# Patient Record
Sex: Female | Born: 1970
Health system: Southern US, Community
[De-identification: ages and names within clinical notes are randomized; demographics above are authoritative.]

## PROBLEM LIST (undated history)

## (undated) DIAGNOSIS — M1990 Unspecified osteoarthritis, unspecified site: Secondary | ICD-10-CM

## (undated) DIAGNOSIS — D649 Anemia, unspecified: Secondary | ICD-10-CM

## (undated) DIAGNOSIS — E039 Hypothyroidism, unspecified: Secondary | ICD-10-CM

## (undated) DIAGNOSIS — Z5189 Encounter for other specified aftercare: Secondary | ICD-10-CM

## (undated) DIAGNOSIS — K5190 Ulcerative colitis, unspecified, without complications: Secondary | ICD-10-CM

## (undated) DIAGNOSIS — K922 Gastrointestinal hemorrhage, unspecified: Secondary | ICD-10-CM

## (undated) DIAGNOSIS — K635 Polyp of colon: Secondary | ICD-10-CM

## (undated) HISTORY — DX: Gastrointestinal hemorrhage, unspecified: K92.2

## (undated) HISTORY — PX: COLONOSCOPY: SHX5424

## (undated) HISTORY — DX: Polyp of colon: K63.5

## (undated) HISTORY — DX: Encounter for other specified aftercare: Z51.89

## (undated) HISTORY — DX: Ulcerative colitis, unspecified, without complications: K51.90

## (undated) HISTORY — DX: Unspecified osteoarthritis, unspecified site: M19.90

## (undated) HISTORY — DX: Anemia, unspecified: D64.9

## (undated) HISTORY — DX: Hypothyroidism, unspecified: E03.9

## (undated) HISTORY — PX: HEMORRHOIDECTOMY WITH HEMORRHOID BANDING: SHX5633

---

## 1997-12-27 ENCOUNTER — Inpatient Hospital Stay (HOSPITAL_COMMUNITY): Admission: EM | Admit: 1997-12-27 | Discharge: 1997-12-28 | Payer: Self-pay

## 1999-01-31 ENCOUNTER — Inpatient Hospital Stay (HOSPITAL_COMMUNITY): Admission: EM | Admit: 1999-01-31 | Discharge: 1999-02-03 | Payer: Self-pay | Admitting: Emergency Medicine

## 1999-01-31 ENCOUNTER — Encounter: Payer: Self-pay | Admitting: Emergency Medicine

## 1999-06-07 ENCOUNTER — Emergency Department (HOSPITAL_COMMUNITY): Admission: EM | Admit: 1999-06-07 | Discharge: 1999-06-07 | Payer: Self-pay | Admitting: Emergency Medicine

## 1999-07-16 ENCOUNTER — Ambulatory Visit (HOSPITAL_COMMUNITY): Admission: RE | Admit: 1999-07-16 | Discharge: 1999-07-16 | Payer: Self-pay | Admitting: Gastroenterology

## 1999-07-16 ENCOUNTER — Encounter: Payer: Self-pay | Admitting: Gastroenterology

## 1999-10-16 ENCOUNTER — Ambulatory Visit (HOSPITAL_COMMUNITY): Admission: RE | Admit: 1999-10-16 | Discharge: 1999-10-16 | Payer: Self-pay | Admitting: Gastroenterology

## 1999-10-16 ENCOUNTER — Encounter: Payer: Self-pay | Admitting: Gastroenterology

## 2004-11-22 ENCOUNTER — Emergency Department (HOSPITAL_COMMUNITY): Admission: EM | Admit: 2004-11-22 | Discharge: 2004-11-22 | Payer: Self-pay | Admitting: Emergency Medicine

## 2014-03-22 ENCOUNTER — Encounter: Payer: Self-pay | Admitting: Internal Medicine

## 2014-05-13 ENCOUNTER — Encounter: Payer: Self-pay | Admitting: Internal Medicine

## 2014-05-30 ENCOUNTER — Ambulatory Visit (INDEPENDENT_AMBULATORY_CARE_PROVIDER_SITE_OTHER): Payer: BLUE CROSS/BLUE SHIELD | Admitting: Internal Medicine

## 2014-05-30 ENCOUNTER — Other Ambulatory Visit (INDEPENDENT_AMBULATORY_CARE_PROVIDER_SITE_OTHER): Payer: BLUE CROSS/BLUE SHIELD

## 2014-05-30 ENCOUNTER — Encounter: Payer: Self-pay | Admitting: Internal Medicine

## 2014-05-30 VITALS — BP 106/62 | HR 76 | Ht 60.0 in | Wt 131.4 lb

## 2014-05-30 DIAGNOSIS — K51919 Ulcerative colitis, unspecified with unspecified complications: Secondary | ICD-10-CM | POA: Diagnosis not present

## 2014-05-30 LAB — COMPREHENSIVE METABOLIC PANEL
ALT: 9 U/L (ref 0–35)
AST: 16 U/L (ref 0–37)
Albumin: 4.3 g/dL (ref 3.5–5.2)
Alkaline Phosphatase: 65 U/L (ref 39–117)
BUN: 5 mg/dL — ABNORMAL LOW (ref 6–23)
CO2: 28 mEq/L (ref 19–32)
Calcium: 9.5 mg/dL (ref 8.4–10.5)
Chloride: 104 mEq/L (ref 96–112)
Creatinine, Ser: 0.47 mg/dL (ref 0.40–1.20)
GFR: 154.8 mL/min (ref >60.00)
Glucose, Bld: 84 mg/dL (ref 70–99)
Potassium: 3.8 mEq/L (ref 3.5–5.1)
Sodium: 136 mEq/L (ref 135–145)
Total Bilirubin: 0.4 mg/dL (ref 0.2–1.2)
Total Protein: 7.7 g/dL (ref 6.0–8.3)

## 2014-05-30 LAB — CBC WITH DIFFERENTIAL/PLATELET
Basophils Absolute: 0 10*3/uL (ref 0.0–0.1)
Basophils Relative: 0.2 % (ref 0.0–3.0)
Eosinophils Absolute: 0 10*3/uL (ref 0.0–0.7)
Eosinophils Relative: 0.2 % (ref 0.0–5.0)
HCT: 35.4 % — ABNORMAL LOW (ref 36.0–46.0)
Hemoglobin: 11.5 g/dL — ABNORMAL LOW (ref 12.0–15.0)
Lymphocytes Relative: 27.6 % (ref 12.0–46.0)
Lymphs Abs: 1.8 10*3/uL (ref 0.7–4.0)
MCHC: 32.5 g/dL (ref 30.0–36.0)
MCV: 72.7 fl — ABNORMAL LOW (ref 78.0–100.0)
Monocytes Absolute: 0.3 10*3/uL (ref 0.1–1.0)
Monocytes Relative: 4.4 % (ref 3.0–12.0)
Neutro Abs: 4.4 10*3/uL (ref 1.4–7.7)
Neutrophils Relative %: 67.6 % (ref 43.0–77.0)
Platelets: 361 10*3/uL (ref 150.0–400.0)
RBC: 4.87 Mil/uL (ref 3.87–5.11)
RDW: 16.9 % — ABNORMAL HIGH (ref 11.5–15.5)
WBC: 6.6 10*3/uL (ref 4.0–10.5)

## 2014-05-30 LAB — IBC PANEL
Iron: 28 ug/dL — ABNORMAL LOW (ref 42–145)
Saturation Ratios: 7.7 % — ABNORMAL LOW (ref 20.0–50.0)
Transferrin: 260 mg/dL (ref 212.0–360.0)

## 2014-05-30 LAB — VITAMIN B12: Vitamin B-12: 376 pg/mL (ref 211–911)

## 2014-05-30 LAB — SEDIMENTATION RATE: Sed Rate: 17 mm/hr (ref 0–22)

## 2014-05-30 NOTE — Patient Instructions (Addendum)
Dr Edison Pace, [facility], [place] physician has requested that you go to the basement for lab work before leaving today. You have been scheduled for a colonoscopy. Please follow written instructions given to you at your visit today.  Please pick up your prep supplies at the pharmacy within the next 1-3 days. If you use inhalers (even only as needed), please bring them with you on the day of your procedure. Your physician has requested that you go to www.startemmi.com and enter the access code given to you at your visit today. This web site gives a general overview about your procedure. However, you should still follow specific instructions given to you by our office regarding your preparation for the procedure.

## 2014-05-30 NOTE — Progress Notes (Signed)
Ebony [name] 05/26/1970 [id number]  Note: This dictation was prepared with Dragon digital system. Any transcriptional errors that result from this procedure are unintentional.   History of Present Illness: This is a 44 year old white female who has a diagnosis of ulcerative colitis since 1995 followed by Dr. Amedeo Plenty. She stopped all medical follow up in 2001 after she lost her insurance and has not been seen for past 15 years. We do not have records from her initial diagnosis but she describes having severe diarrhea ,rectal bleeding requiring hospitalization and blood transfusions. She was on steroids and mesalamine. She was never on biologicals. After she lost her insurance her disease slowly calmed  down and she was able to function with the exception all for brief flareups of diarrhea mucosa fecal urgency and some bleeding. She has recently acquired an insurance and would like to be reassessed. There is no family history of inflammatory bowel disease or colon cancer. She denies aphthous stomatitis. She has joint pains involving her knees as well as her hands and wrists. Her weight has been stable. Her level of energy has been low. Ultrasound of the upper abdomen in June 2001 was negative. Barium esophagram in 2001 by Dr. Amedeo Plenty showed small hiatal hernia and normal motility.     Past Medical History  Diagnosis Date  . Anemia   . Colon polyps 1997 or 1998?  Marland Kitchen GI bleeding   . Ulcerative colitis     Past Surgical History  Procedure Laterality Date  . Colonoscopy  1997 or 1998?    No Known Allergies  Family history and social history have been reviewed.  Review of Systems: Intermittent diarrhea. Small amount of mucus and low-volume bleeding.  The remainder of the 10 point ROS is negative except as outlined in the H&P  Physical Exam: General Appearance Well developed, in no distress Eyes  Non icteric  HEENT  Non traumatic, normocephalic  Mouth No lesion, tongue papillated, no  cheilosis Neck Supple without adenopathy, thyroid not enlarged, no carotid bruits, no JVD Lungs Clear to auscultation bilaterally COR Normal S1, normal S2, regular rhythm, no murmur, quiet precordium Abdomen soft with minimal tenderness in left lower quadrant. Normoactive bowel sounds. No distention or tympany Rectal normal perianal area. Normal rectal sphincter tone. No stool Extremities  No pedal edema Skin No lesions Neurological Alert and oriented x 3 Psychological Normal mood and affect  Assessment and Plan:   44 year old white female with  at least 20 year history of ulcerative colitis lost to follow-up for past 15 years. She now has an insurance and needs to be reevaluated. She most likely she had a left-sided colitis. She was taking steroid enemas  and  Systemic steroids. Currently her symptoms are rather mild. We will schedule her for colonoscopy to assess the activity disease and to rule out dysplasia. We will also also check her iron levels. Blood count. B12 and sedimentation rate. We will discuss the treatment after we obtain information but the extent and severity of the disease. Today we have discussed possibility of biologicals or mesalamine or immunomodulators.    Ebony [name] 05/30/2014

## 2014-05-31 ENCOUNTER — Ambulatory Visit: Payer: Self-pay | Admitting: Internal Medicine

## 2014-06-04 ENCOUNTER — Encounter: Payer: Self-pay | Admitting: Internal Medicine

## 2014-06-21 ENCOUNTER — Telehealth: Payer: Self-pay | Admitting: Internal Medicine

## 2014-06-21 NOTE — Telephone Encounter (Signed)
Spoke with patient and explained that she has UC and it is a diagnostic procedure (colonoscopy). She cancelled the procedure until she can afford it.

## 2014-06-21 NOTE — Telephone Encounter (Signed)
She is having symptoms and she is only 44 yo, so it would be hard to code it as screening. The only way to code it as screening would be a HIGH risk for colon cancer  In patient with U,Colitis with greater than 20 years duration. We can try if they accept it.

## 2014-07-03 ENCOUNTER — Encounter: Payer: BLUE CROSS/BLUE SHIELD | Admitting: Internal Medicine

## 2015-08-19 ENCOUNTER — Other Ambulatory Visit: Payer: Self-pay | Admitting: Certified Nurse Midwife

## 2015-08-19 DIAGNOSIS — Z1231 Encounter for screening mammogram for malignant neoplasm of breast: Secondary | ICD-10-CM

## 2015-08-26 ENCOUNTER — Other Ambulatory Visit: Payer: Self-pay | Admitting: *Deleted

## 2015-08-26 ENCOUNTER — Inpatient Hospital Stay
Admission: RE | Admit: 2015-08-26 | Discharge: 2015-08-26 | Disposition: A | Discharge status: Home / Self Care | Payer: Self-pay | Source: Ambulatory Visit | Attending: *Deleted | Admitting: *Deleted

## 2015-08-26 DIAGNOSIS — Z9289 Personal history of other medical treatment: Secondary | ICD-10-CM

## 2015-09-04 ENCOUNTER — Other Ambulatory Visit: Payer: Self-pay | Admitting: Certified Nurse Midwife

## 2015-09-04 ENCOUNTER — Ambulatory Visit
Admission: RE | Admit: 2015-09-04 | Discharge: 2015-09-04 | Disposition: A | Discharge status: Home / Self Care | Payer: BLUE CROSS/BLUE SHIELD | Source: Ambulatory Visit | Attending: Certified Nurse Midwife | Admitting: Certified Nurse Midwife

## 2015-09-04 DIAGNOSIS — Z1231 Encounter for screening mammogram for malignant neoplasm of breast: Secondary | ICD-10-CM | POA: Insufficient documentation

## 2015-09-04 LAB — HM PAP SMEAR: HM Pap smear: NEGATIVE

## 2016-02-17 DIAGNOSIS — Z23 Encounter for immunization: Secondary | ICD-10-CM | POA: Diagnosis not present

## 2016-04-13 ENCOUNTER — Telehealth: Payer: Self-pay

## 2016-04-13 NOTE — Telephone Encounter (Signed)
Pt calling.  Wants to discuss changes in her menstrual cycle.  [phone].

## 2016-04-14 NOTE — Telephone Encounter (Signed)
Pt states she did not get her cycle for this month and has had 2 negative UPT's. Pt also c/o heavier cycles and spotting between periods. Advised to schedule appt for menstrual issues. Pt to call back to schedule.

## 2016-09-23 ENCOUNTER — Encounter: Payer: Self-pay | Admitting: Advanced Practice Midwife

## 2016-09-23 ENCOUNTER — Ambulatory Visit (INDEPENDENT_AMBULATORY_CARE_PROVIDER_SITE_OTHER): Payer: BLUE CROSS/BLUE SHIELD | Admitting: Advanced Practice Midwife

## 2016-09-23 ENCOUNTER — Telehealth: Payer: Self-pay | Admitting: Advanced Practice Midwife

## 2016-09-23 VITALS — BP 110/80 | HR 66 | Ht 60.0 in | Wt 142.0 lb

## 2016-09-23 DIAGNOSIS — Z113 Encounter for screening for infections with a predominantly sexual mode of transmission: Secondary | ICD-10-CM | POA: Diagnosis not present

## 2016-09-23 DIAGNOSIS — Z124 Encounter for screening for malignant neoplasm of cervix: Secondary | ICD-10-CM

## 2016-09-23 DIAGNOSIS — Z01419 Encounter for gynecological examination (general) (routine) without abnormal findings: Secondary | ICD-10-CM

## 2016-09-23 DIAGNOSIS — N946 Dysmenorrhea, unspecified: Secondary | ICD-10-CM | POA: Diagnosis not present

## 2016-09-23 DIAGNOSIS — Z0000 Encounter for general adult medical examination without abnormal findings: Secondary | ICD-10-CM

## 2016-09-23 MED ORDER — TRAMADOL HCL 50 MG PO TABS: 50.0000 mg | ORAL_TABLET | Freq: Four times a day (QID) | ORAL | 4 refills | Status: DC | PRN

## 2016-09-23 NOTE — Progress Notes (Addendum)
Patient ID: Ebony [name], female   DOB: 1970/10/15, 46 y.o.   MRN: [id number]    Gynecology Annual Exam  PCP: Patient, No Pcp Per  Chief Complaint:  Chief Complaint  Patient presents with  . Gynecologic Exam    getting older and things changes; wants full blood panel done - is fasting    History of Present Illness: Patient is a 46 y.o. G0P0000 presents for annual exam. The patient has complaints today of some irregularity with her menstrual cycles in the past 2 years. She does have a rash on her left elbow that she has noticed for several months. She denies itching or pain. The rash has not changed in appearance in that time. She has tried anti-fungal cream and hydrocortisone cream without any changes. The rash has an irregular border but resembles ringworm. She also has a complaint of a lump on her left foot that she has noticed for about the last month. Originally the lump was tender but is no longer tender. The lump is on the top of the foot close to the insole area.   LMP: Patient's last menstrual period was 09/07/2016. Average Interval: irregular, 28-40  days Duration of flow: 5 days Heavy Menses: yes Clots: yes Intermenstrual Bleeding: no Postcoital Bleeding: no Dysmenorrhea: no   The patient is sexually active. She currently uses condoms for contraception. She denies dyspareunia.  The patient does occasionally perform self breast exams.  There is no notable family history of breast or ovarian cancer in her family.  The patient wears seatbelts: yes.   The patient has regular exercise: yes.    The patient denies current symptoms of depression.    Review of Systems: Review of Systems  Constitutional: Negative.   HENT: Negative.   Eyes: Negative.   Respiratory: Negative.   Cardiovascular: Negative.   Gastrointestinal: Negative.   Genitourinary: Negative.   Musculoskeletal: Negative.   Skin: Negative.   Neurological: Negative.   Endo/Heme/Allergies: Negative.     Psychiatric/Behavioral: Negative.     Past Medical History:  Past Medical History:  Diagnosis Date  . Anemia   . Colon polyps 1997 or 1998?  Marland Kitchen GI bleeding   . Ulcerative colitis [facility])     Past Surgical History:  Past Surgical History:  Procedure Laterality Date  . COLONOSCOPY  1997 or 1998?    Gynecologic History:  Patient's last menstrual period was 09/07/2016. Contraception: condoms Last Pap:1 year ago Results were:  no abnormalities  Last mammogram: 1 year ago Results were: BI-RAD I Obstetric History: G0P0000  Family History:  Family History  Problem Relation Age of Onset  . Diabetes Father   . Heart disease Father   . Diabetes Mother   . Diabetes Brother   . Heart disease Brother   . Heart disease Maternal Grandmother   . Colon cancer Neg Hx   . Colon polyps Neg Hx   . Esophageal cancer Neg Hx   . Kidney disease Neg Hx   . Gallbladder disease Neg Hx     Social History:  Social History   Social History  . Marital status: Single    Spouse name: N/A  . Number of children: 1  . Years of education: N/A   Occupational History  . Sales    Social History Main Topics  . Smoking status: Never Smoker  . Smokeless tobacco: Never Used  . Alcohol use 0.0 oz/week     Comment: Rarely  . Drug use: No  . Sexual activity:  Yes    Birth control/ protection: None, Condom   Other Topics Concern  . Not on file   Social History Narrative  . No narrative on file    Allergies:  No Known Allergies  Medications: Prior to Admission medications   Medication Sig Start Date End Date Taking? Authorizing Provider  Cyanocobalamin (VITAMIN B12 PO) Take 1 tablet by mouth as needed.   Yes [provider]  Flaxseed, Linseed, (FLAXSEED OIL PO) Take 1 tablet by mouth as needed.   Yes [provider]  OVER THE COUNTER MEDICATION Take 1 tablet by mouth as needed. Pt taking Tumeric   Yes [provider]  Probiotic Product (PROBIOTIC DAILY PO) Take 1  tablet by mouth as needed.   Yes [provider]    Physical Exam Vitals: Blood pressure 110/80, pulse 66, height 5' (1.524 m), weight 142 lb (64.4 kg), last menstrual period 09/07/2016.  General: NAD HEENT: normocephalic, anicteric Thyroid: no enlargement, no palpable nodules Pulmonary: No increased work of breathing, CTAB Cardiovascular: RRR, distal pulses 2+ Breast: Breast symmetrical, no tenderness, no palpable nodules or masses, no skin or nipple retraction present, no nipple discharge.  No axillary or supraclavicular lymphadenopathy. Abdomen: NABS, soft, non-tender, non-distended.  Umbilicus without lesions.  No hepatomegaly, splenomegaly or masses palpable. No evidence of hernia  Genitourinary:  External: Normal external female genitalia.  Normal urethral meatus, normal  Bartholin's and Skene's glands.    Vagina: Normal vaginal mucosa, no evidence of prolapse.    Cervix: Grossly normal in appearance, no bleeding, no CMT  Uterus: Non-enlarged, mobile, normal contour.    Adnexa: ovaries non-enlarged, no adnexal masses  Rectal: deferred  Lymphatic: no evidence of inguinal lymphadenopathy Extremities: no edema, erythema, or tenderness, non-tender swelling on top/inside of left foot Neurologic: Grossly intact Psychiatric: mood appropriate, affect full   Assessment: 46 y.o. G0P0000 routine annual exam  Plan: Problem List Items Addressed This Visit    None    Visit Diagnoses    Well woman exam with routine gynecological exam    -  Primary   Relevant Orders   IGP,CtNgTv,Apt HPV,rfx16/18,45   Cervical cancer screening       Relevant Orders   IGP,CtNgTv,Apt HPV,rfx16/18,45   Screen for sexually transmitted diseases       Relevant Orders   IGP,CtNgTv,Apt HPV,rfx16/18,45   Blood tests for routine general physical examination       Relevant Orders   Hgb A1c w/o eAG   VITAMIN D 25 Hydroxy (Vit-D Deficiency, Fractures)   Lipid Panel With LDL/HDL Ratio   CBC with  Differential/Platelet      1) Mammogram - recommend yearly screening mammogram.  Mammogram patient prefers 2 year screening interval   2) STI screening was offered and accepted  3) ASCCP guidelines and rational discussed.  Patient opts for may go to every 3 year following this year screening interval  4) Contraception - patient prefers to continue with condoms  5) Colonoscopy - Screening recommended starting at age 40 for average risk individuals, age 2 for individuals deemed at increased risk (including African Americans) and recommended to continue until age 51.  For patient age 26-85 individualized approach is recommended.  Gold standard screening is via colonoscopy, Cologuard screening is an acceptable alternative for patient unwilling or unable to undergo colonoscopy.  "Colorectal cancer screening for average?risk adults: 2018 guideline update from the [employer]: A Cancer Journal for Clinicians: Jun 16, 2016. Recommendation discussed.   6) Routine healthcare maintenance including cholesterol,  diabetes screening discussed Ordered today   7) Try aloe gel on rash and arnica gel on foot  7) Return to clinic in 1 year for annual exam  Rod Can, CNM

## 2016-09-23 NOTE — Telephone Encounter (Signed)
Please advise 

## 2016-09-23 NOTE — Telephone Encounter (Signed)
Pt was seen today for annual and was needing her Tramadol refilled . [place] , [place].

## 2016-09-23 NOTE — Telephone Encounter (Signed)
Spoke with patient to tell her the Rx is printed and she will need to come in to pick it up.

## 2016-09-23 NOTE — Addendum Note (Signed)
Addended by: Rod Can on: 09/23/2016 05:20 PM   Modules accepted: Orders

## 2016-09-24 LAB — LIPID PANEL WITH LDL/HDL RATIO
Cholesterol, Total: 198 mg/dL (ref 100–199)
HDL: 58 mg/dL (ref >39)
LDL Calculated: 122 mg/dL — ABNORMAL HIGH (ref 0–99)
LDl/HDL Ratio: 2.1 ratio (ref 0.0–3.2)
Triglycerides: 90 mg/dL (ref 0–149)
VLDL Cholesterol Cal: 18 mg/dL (ref 5–40)

## 2016-09-24 LAB — VITAMIN D 25 HYDROXY (VIT D DEFICIENCY, FRACTURES): Vit D, 25-Hydroxy: 26.3 ng/mL — ABNORMAL LOW (ref 30.0–100.0)

## 2016-09-24 LAB — CBC WITH DIFFERENTIAL/PLATELET
Basophils Absolute: 0 10*3/uL (ref 0.0–0.2)
Basos: 0 %
EOS (ABSOLUTE): 0.1 10*3/uL (ref 0.0–0.4)
Eos: 2 %
Hematocrit: 40.6 % (ref 34.0–46.6)
Hemoglobin: 13.6 g/dL (ref 11.1–15.9)
Immature Grans (Abs): 0 10*3/uL (ref 0.0–0.1)
Immature Granulocytes: 0 %
Lymphocytes Absolute: 1.3 10*3/uL (ref 0.7–3.1)
Lymphs: 29 %
MCH: 30.8 pg (ref 26.6–33.0)
MCHC: 33.5 g/dL (ref 31.5–35.7)
MCV: 92 fL (ref 79–97)
Monocytes Absolute: 0.4 10*3/uL (ref 0.1–0.9)
Monocytes: 8 %
Neutrophils Absolute: 2.7 10*3/uL (ref 1.4–7.0)
Neutrophils: 61 %
Platelets: 266 10*3/uL (ref 150–379)
RBC: 4.41 x10E6/uL (ref 3.77–5.28)
RDW: 13.6 % (ref 12.3–15.4)
WBC: 4.4 10*3/uL (ref 3.4–10.8)

## 2016-09-24 LAB — HGB A1C W/O EAG: Hgb A1c MFr Bld: 5 % (ref 4.8–5.6)

## 2016-09-28 LAB — IGP,CTNGTV,APT HPV,RFX16/18,45
Chlamydia, Nuc. Acid Amp: NEGATIVE
Gonococcus, Nuc. Acid Amp: NEGATIVE
HPV Aptima: NEGATIVE
PAP Smear Comment: 0
Trich vag by NAA: NEGATIVE

## 2017-12-07 ENCOUNTER — Encounter: Payer: Self-pay | Admitting: Obstetrics and Gynecology

## 2017-12-07 ENCOUNTER — Ambulatory Visit
Admission: RE | Admit: 2017-12-07 | Discharge: 2017-12-07 | Disposition: A | Discharge status: Home / Self Care | Payer: BLUE CROSS/BLUE SHIELD | Source: Ambulatory Visit | Attending: Obstetrics and Gynecology | Admitting: Obstetrics and Gynecology

## 2017-12-07 ENCOUNTER — Ambulatory Visit (INDEPENDENT_AMBULATORY_CARE_PROVIDER_SITE_OTHER): Payer: BLUE CROSS/BLUE SHIELD | Admitting: Obstetrics and Gynecology

## 2017-12-07 VITALS — BP 110/70 | HR 84 | Ht 61.0 in | Wt 145.0 lb

## 2017-12-07 DIAGNOSIS — Z13220 Encounter for screening for lipoid disorders: Secondary | ICD-10-CM

## 2017-12-07 DIAGNOSIS — Z1239 Encounter for other screening for malignant neoplasm of breast: Secondary | ICD-10-CM | POA: Insufficient documentation

## 2017-12-07 DIAGNOSIS — Z23 Encounter for immunization: Secondary | ICD-10-CM | POA: Diagnosis not present

## 2017-12-07 DIAGNOSIS — Z01419 Encounter for gynecological examination (general) (routine) without abnormal findings: Secondary | ICD-10-CM | POA: Diagnosis not present

## 2017-12-07 DIAGNOSIS — K5180 Other ulcerative colitis without complications: Secondary | ICD-10-CM

## 2017-12-07 DIAGNOSIS — Z0000 Encounter for general adult medical examination without abnormal findings: Secondary | ICD-10-CM

## 2017-12-07 DIAGNOSIS — Z1231 Encounter for screening mammogram for malignant neoplasm of breast: Secondary | ICD-10-CM | POA: Diagnosis not present

## 2017-12-07 DIAGNOSIS — Z8249 Family history of ischemic heart disease and other diseases of the circulatory system: Secondary | ICD-10-CM | POA: Insufficient documentation

## 2017-12-07 DIAGNOSIS — K5190 Ulcerative colitis, unspecified, without complications: Secondary | ICD-10-CM | POA: Insufficient documentation

## 2017-12-07 DIAGNOSIS — G43839 Menstrual migraine, intractable, without status migrainosus: Secondary | ICD-10-CM | POA: Insufficient documentation

## 2017-12-07 MED ORDER — TRAMADOL HCL 50 MG PO TABS: 50.0000 mg | ORAL_TABLET | Freq: Four times a day (QID) | ORAL | 0 refills | Status: DC | PRN

## 2017-12-07 NOTE — Patient Instructions (Signed)
I value your feedback and entrusting us with your care. If you get a [facility] patient survey, I would appreciate you taking the time to let us know about your experience today. Thank you!  [facility]: [phone]  [facility]: [phone]  

## 2017-12-07 NOTE — Progress Notes (Signed)
PCP:  Patient, No Pcp Per   Chief Complaint  Patient presents with  . Gynecologic Exam    for the past 3 yrs approx she has bad migraines during cycle, wants flu shot     HPI:      [name] is a 47 y.o. G0P0000 who LMP was Patient's last menstrual period was 11/27/2017 (approximate)., presents today for her annual examination.  Her menses are every 1-2 months, lasting 5 days.  Dysmenorrhea mild, occurring first 1-2 days of flow. She had 1 episode intermenstrual bleeding a few months ago, but this is unusual for pt. Has menstrual migraines only. No sx other times of cycle. Takes tramadol sparingly with sx relief. Needs Rx RF.   Sex activity: single partner, contraception - condoms, declines other BC.  Last Pap: September 23, 2016  Results were: no abnormalities /neg HPV DNA  Hx of STDs: none  Last mammogram: August 26, 2015  Results were: normal--routine follow-up in 12 months There is no FH of breast cancer. There is no FH of ovarian cancer. The patient does do self-breast exams.  Tobacco use: The patient denies current or previous tobacco use. Alcohol use: none No drug use.  Exercise: moderately active  She does get adequate calcium but not Vitamin D in her diet. She would like fasting labs. Strong FH CAD on both sides.  Pt with hx of ulcerative colitis. Was getting regular colonoscopies/f/u but was without insurance for a couple yrs. Has insurance again and wants GI ref.   Past Medical History:  Diagnosis Date  . Anemia   . Colon polyps 1997 or 1998?  Marland Kitchen GI bleeding   . Ulcerative colitis [facility])     Past Surgical History:  Procedure Laterality Date  . COLONOSCOPY  1997 or 1998?    Family History  Problem Relation Age of Onset  . Diabetes Father   . Heart disease Father   . Hypertension Father   . Hyperlipidemia Father   . Diabetes Mother   . Hypertension Mother   . Dementia Mother        senile  . Heart attack Mother 28  . Diabetes Brother   .  Heart disease Brother   . Heart disease Maternal Grandmother   . Colon cancer Neg Hx   . Colon polyps Neg Hx   . Esophageal cancer Neg Hx   . Kidney disease Neg Hx   . Gallbladder disease Neg Hx     Social History   Socioeconomic History  . Marital status: Single    Spouse name: Not on file  . Number of children: 1  . Years of education: Not on file  . Highest education level: Not on file  Occupational History  . Occupation: Press photographer  Social Needs  . Financial resource strain: Not on file  . Food insecurity:    Worry: Not on file    Inability: Not on file  . Transportation needs:    Medical: Not on file    Non-medical: Not on file  Tobacco Use  . Smoking status: Never Smoker  . Smokeless tobacco: Never Used  Substance and Sexual Activity  . Alcohol use: Yes    Alcohol/week: 0.0 standard drinks    Comment: Rarely  . Drug use: No  . Sexual activity: Yes    Birth control/protection: Condom  Lifestyle  . Physical activity:    Days per week: Not on file    Minutes per session: Not on file  .  Stress: Not on file  Relationships  . Social connections:    Talks on phone: Not on file    Gets together: Not on file    Attends religious service: Not on file    Active member of club or organization: Not on file    Attends meetings of clubs or organizations: Not on file    Relationship status: Not on file  . Intimate partner violence:    Fear of current or ex partner: Not on file    Emotionally abused: Not on file    Physically abused: Not on file    Forced sexual activity: Not on file  Other Topics Concern  . Not on file  Social History Narrative  . Not on file    Outpatient Medications Prior to Visit  Medication Sig Dispense Refill  . Flaxseed, Linseed, (FLAXSEED OIL PO) Take 1 tablet by mouth as needed.    Marland Kitchen OVER THE COUNTER MEDICATION Take 1 tablet by mouth as needed. Pt taking Tumeric    . Probiotic Product (PROBIOTIC DAILY PO) Take 1 tablet by mouth as needed.      . traMADol (ULTRAM) 50 MG tablet Take 1 tablet (50 mg total) by mouth every 6 (six) hours as needed. 30 tablet 4  . Cyanocobalamin (VITAMIN B12 PO) Take 1 tablet by mouth as needed.     No facility-administered medications prior to visit.       ROS:  Review of Systems  Constitutional: Negative for fatigue, fever and unexpected weight change.  Respiratory: Negative for cough, shortness of breath and wheezing.   Cardiovascular: Negative for chest pain, palpitations and leg swelling.  Gastrointestinal: Negative for blood in stool, constipation, diarrhea, nausea and vomiting.  Endocrine: Negative for cold intolerance, heat intolerance and polyuria.  Genitourinary: Negative for dyspareunia, dysuria, flank pain, frequency, genital sores, hematuria, menstrual problem, pelvic pain, urgency, vaginal bleeding, vaginal discharge and vaginal pain.  Musculoskeletal: Positive for arthralgias. Negative for back pain, joint swelling and myalgias.  Skin: Negative for rash.  Neurological: Positive for headaches. Negative for dizziness, syncope, light-headedness and numbness.  Hematological: Negative for adenopathy.  Psychiatric/Behavioral: Positive for agitation. Negative for confusion, sleep disturbance and suicidal ideas. The patient is not nervous/anxious.   BREAST: No symptoms   Objective: BP 110/70   Pulse 84   Ht 5' 1"  (1.549 m)   Wt 145 lb (65.8 kg)   LMP 11/27/2017 (Approximate)   BMI 27.40 kg/m    Physical Exam  Constitutional: She is oriented to person, place, and time. She appears well-developed and well-nourished.  Genitourinary: Vagina normal and uterus normal. There is no rash or tenderness on the right labia. There is no rash or tenderness on the left labia. No erythema or tenderness in the vagina. No vaginal discharge found. Right adnexum does not display mass and does not display tenderness. Left adnexum does not display mass and does not display tenderness. Cervix does not  exhibit motion tenderness or polyp. Uterus is not enlarged or tender.  Neck: Normal range of motion. No thyromegaly present.  Cardiovascular: Normal rate, regular rhythm and normal heart sounds.  No murmur heard. Pulmonary/Chest: Effort normal and breath sounds normal. Right breast exhibits no mass, no nipple discharge, no skin change and no tenderness. Left breast exhibits no mass, no nipple discharge, no skin change and no tenderness.  Abdominal: Soft. There is no tenderness. There is no guarding.  Musculoskeletal: Normal range of motion.  Neurological: She is alert and oriented to person, place, and  time. No cranial nerve deficit.  Psychiatric: She has a normal mood and affect. Her behavior is normal.  Vitals reviewed.   Assessment/Plan: Encounter for annual routine gynecological examination  Screening for breast cancer - Pt to sched mammo - Plan: MM 3D SCREEN BREAST BILATERAL  Blood tests for routine general physical examination - Plan: Comprehensive metabolic panel, Lipid panel  Screening cholesterol level - Plan: Lipid panel  Other ulcerative colitis without complication ([place]) - Refer to GI for f/u.  - Plan: Ambulatory referral to Gastroenterology  Intractable menstrual migraine without status migrainosus - Rx RF tramadol. Use sparingly - Plan: traMADol (ULTRAM) 50 MG tablet  Family history of MI (myocardial infarction) - Refer to PCP for cardio screening/may need cardiac ref.  Needs flu shot - Plan: Flu Vaccine QUAD 36+ mos IM (Fluarix, Quad PF)  Meds ordered this encounter  Medications  . traMADol (ULTRAM) 50 MG tablet    Sig: Take 1 tablet (50 mg total) by mouth every 6 (six) hours as needed.    Dispense:  30 tablet    Refill:  0    Order Specific Question:   Supervising Provider    Answer:   Gae Dry [[id number]]             GYN counsel breast self exam, mammography screening, adequate intake of calcium and vitamin D, diet and exercise     F/U  Return in about  1 year (around 12/08/2018).  Ebony B. Copland, PA-C 12/07/2017 12:12 PM

## 2017-12-08 LAB — COMPREHENSIVE METABOLIC PANEL
ALT: 7 IU/L (ref 0–32)
AST: 15 IU/L (ref 0–40)
Albumin/Globulin Ratio: 1.5 (ref 1.2–2.2)
Albumin: 4.2 g/dL (ref 3.5–5.5)
Alkaline Phosphatase: 62 IU/L (ref 39–117)
BUN/Creatinine Ratio: 11 (ref 9–23)
BUN: 6 mg/dL (ref 6–24)
Bilirubin Total: 0.2 mg/dL (ref 0.0–1.2)
CO2: 21 mmol/L (ref 20–29)
Calcium: 9.1 mg/dL (ref 8.7–10.2)
Chloride: 102 mmol/L (ref 96–106)
Creatinine, Ser: 0.53 mg/dL — ABNORMAL LOW (ref 0.57–1.00)
GFR calc Af Amer: 133 mL/min/{1.73_m2} (ref >59)
GFR calc non Af Amer: 115 mL/min/{1.73_m2} (ref >59)
Globulin, Total: 2.8 g/dL (ref 1.5–4.5)
Glucose: 77 mg/dL (ref 65–99)
Potassium: 4.7 mmol/L (ref 3.5–5.2)
Sodium: 137 mmol/L (ref 134–144)
Total Protein: 7 g/dL (ref 6.0–8.5)

## 2017-12-08 LAB — LIPID PANEL
Chol/HDL Ratio: 2.9 ratio (ref 0.0–4.4)
Cholesterol, Total: 201 mg/dL — ABNORMAL HIGH (ref 100–199)
HDL: 69 mg/dL (ref >39)
LDL Calculated: 111 mg/dL — ABNORMAL HIGH (ref 0–99)
Triglycerides: 106 mg/dL (ref 0–149)
VLDL Cholesterol Cal: 21 mg/dL (ref 5–40)

## 2017-12-08 NOTE — Progress Notes (Signed)
Pt aware.

## 2017-12-08 NOTE — Progress Notes (Signed)
Pls let pt know labs WNL. Nothing further to be done. Highlighted yellow values are fine. Results released to [place].

## 2018-01-18 HISTORY — PX: BASAL CELL CARCINOMA EXCISION: SHX1214

## 2018-07-11 ENCOUNTER — Ambulatory Visit (INDEPENDENT_AMBULATORY_CARE_PROVIDER_SITE_OTHER): Payer: BC Managed Care – PPO | Admitting: Gastroenterology

## 2018-07-11 ENCOUNTER — Encounter: Payer: Self-pay | Admitting: Gastroenterology

## 2018-07-11 ENCOUNTER — Other Ambulatory Visit (INDEPENDENT_AMBULATORY_CARE_PROVIDER_SITE_OTHER): Payer: BC Managed Care – PPO

## 2018-07-11 DIAGNOSIS — K921 Melena: Secondary | ICD-10-CM

## 2018-07-11 DIAGNOSIS — D509 Iron deficiency anemia, unspecified: Secondary | ICD-10-CM | POA: Diagnosis not present

## 2018-07-11 DIAGNOSIS — R195 Other fecal abnormalities: Secondary | ICD-10-CM

## 2018-07-11 LAB — CBC WITH DIFFERENTIAL/PLATELET
Basophils Absolute: 0 10*3/uL (ref 0.0–0.1)
Basophils Relative: 0.3 % (ref 0.0–3.0)
Eosinophils Absolute: 0.1 10*3/uL (ref 0.0–0.7)
Eosinophils Relative: 1.2 % (ref 0.0–5.0)
HCT: 36.5 % (ref 36.0–46.0)
Hemoglobin: 11.7 g/dL — ABNORMAL LOW (ref 12.0–15.0)
Lymphocytes Relative: 24.5 % (ref 12.0–46.0)
Lymphs Abs: 1.4 10*3/uL (ref 0.7–4.0)
MCHC: 32.2 g/dL (ref 30.0–36.0)
MCV: 82 fl (ref 78.0–100.0)
Monocytes Absolute: 0.4 10*3/uL (ref 0.1–1.0)
Monocytes Relative: 6.5 % (ref 3.0–12.0)
Neutro Abs: 4 10*3/uL (ref 1.4–7.7)
Neutrophils Relative %: 67.5 % (ref 43.0–77.0)
Platelets: 380 10*3/uL (ref 150.0–400.0)
RBC: 4.44 Mil/uL (ref 3.87–5.11)
RDW: 14.9 % (ref 11.5–15.5)
WBC: 5.9 10*3/uL (ref 4.0–10.5)

## 2018-07-11 LAB — IRON: Iron: 32 ug/dL — ABNORMAL LOW (ref 42–145)

## 2018-07-11 LAB — FERRITIN: Ferritin: 5.1 ng/mL — ABNORMAL LOW (ref 10.0–291.0)

## 2018-07-11 LAB — SEDIMENTATION RATE: Sed Rate: 29 mm/hr — ABNORMAL HIGH (ref 0–20)

## 2018-07-11 LAB — C-REACTIVE PROTEIN: CRP: <1 mg/dL (ref 0.5–20.0)

## 2018-07-11 MED ORDER — NA SULFATE-K SULFATE-MG SULF 17.5-3.13-1.6 GM/177ML PO SOLN: 1.0000 | ORAL | 0 refills | Status: DC

## 2018-07-11 NOTE — Progress Notes (Signed)
TELEHEALTH VISIT  Referring Provider: No ref. provider found Primary Care Physician:  Patient, No Pcp Per   Tele-visit due to COVID-19 pandemic Patient requested visit virtually, consented to the virtual encounter via video enabled telemedicine application (Zoom) Contact made at: 13:56 07/11/18 Patient verified by name and date of birth Location of patient: Home Location provider: [place] [facility] Names of persons participating: Me, patient, Tinnie Gens CMA Time spent on telehealth visit: 28 minutes I discussed the limitations of evaluation and management by telemedicine. The patient expressed understanding and agreed to proceed.  Reason for Consultation: Bloating, blood in the stool   IMPRESSION:  Blood and mucous in the stool Early satiety and bloating History of ulcerative colitis diagnosed in 1995    -Previously followed by Dr. Amedeo Plenty and Dr. Olevia Perches    -Treated with steroids and mesalamine: No prior biologic therapy    -Previously required hospitalization and blood transfusion Microcytic anemia on labs from 2016 History of colon polyps (per patient report) Vitamin D deficiency  Irritable bowel syndrome is the most likely diagnosis, although mucous in the stools is normal for some people. Although this does not explain the blood in the stool unless she has concurrent outlet source bleeding.  Ulcerative colitis must be excluded given her history. Other possibilities include food allergy, segmental colitis associated with diverticulosis, and mucous secreting appendiceal tumor. Given this differential, I have recommended a colonoscopy.   Given her history of unexplained iron deficiency, will repeat anemia last (last CBC obtained in 2018). If iron deficiency persists, will plan EGD at the time of colonoscopy.   PLAN: CBC, iron, ferritin ESR, CRP, fecal calprotectin Obtain records from Dr. Amedeo Plenty from the 1990s (clinic notes, endoscopy, path results) Colonoscopy Add EGD  if labs show persistent microcytosis  I consented the patient discussing the risks, benefits, and alternatives to endoscopic evaluation. In particular, we discussed the risks that include, but are not limited to, reaction to medication, cardiopulmonary compromise, bleeding requiring blood transfusion, aspiration resulting in pneumonia, perforation requiring surgery, lack of diagnosis, severe illness requiring hospitalization, and even death. We reviewed the risk of missed lesion including polyps or even cancer. The patient acknowledges these risks and asks that we proceed.   HPI: JOORY GOUGH is a 48 y.o. female self-referred for bloating and blood in the stool.  The history is obtained through the patient and review of her electronic health record.  She was previously seen by Dr. Olevia Perches in 2016. Follows a vegetarian diet. She does not have a PCP. Her GYN is at [facility] in [place].   She carries a diagnosis of ulcerative colitis since 1995 previously followed by Dr. Amedeo Plenty. Diagnosed during pregnancy.  She stopped all medical follow-up in 2001 after she lost her insurance.  She was managed with steroids and mesalamine (Asacol).  She was never on Biologics.  She required hospitalization and blood transfusion. Symptoms resolved within 10 years of onset. She thinks her last colonoscopy was around 1998 and that polyps were removed at that time. She was not seen for 15 years until she reestablished care with Dr. Maurene Capes in 2016.  Dr. Olevia Perches recommended a colonoscopy.  It appears this was never performed.  Early satiety and bloating within minutes of eating. Occasional mucous in the stool. Sometimes she will pass only mucous. Frequent blood in the stool. Feels weak. All of these symptoms are chronic, but, she is concerned because they haven't been recently evaluated. No abdominal pain. No diarrhea or constipation. Good appetite. Weight stable  if not unintentionally increased.  No other associated symptoms.  No identified exacerbating or relieving features.   Mother with IBS. No family history of inflammatory bowel disease.  No known family history of colon cancer or polyps. No family history of uterine/endometrial cancer, pancreatic cancer or gastric/stomach cancer.  Prior imaging studies include: Abdominal ultrasound June 2001: Normal Barium esophagram in September 2001: small sliding hiatal hernia, otherwise unremarkable  Prior labs include: 05/30/14: B12 376, hgb 11.5, MCV 72.7, RDW 16.9, ESR 17,  iron 28, saturation 7.7% 09/23/2016: normal CMP including albumin 4.2, AST 15, ALT 7, alk phos 62, total bilirubin 0.2, vitamin D low at 26.3, hemoglobin A1c 5.0.   Past Medical History:  Diagnosis Date  . Anemia   . Colon polyps 1997 or 1998?  Marland Kitchen GI bleeding   . Ulcerative colitis [facility])     Past Surgical History:  Procedure Laterality Date  . COLONOSCOPY  1997 or 1998?    Current Outpatient Medications  Medication Sig Dispense Refill  . Flaxseed, Linseed, (FLAXSEED OIL PO) Take 1 tablet by mouth as needed.    Marland Kitchen OVER THE COUNTER MEDICATION Take 1 tablet by mouth as needed. Pt taking Tumeric    . Probiotic Product (PROBIOTIC DAILY PO) Take 1 tablet by mouth as needed.    . traMADol (ULTRAM) 50 MG tablet Take 1 tablet (50 mg total) by mouth every 6 (six) hours as needed. 30 tablet 0   No current facility-administered medications for this visit.     Allergies as of 07/11/2018  . (No Known Allergies)    Family History  Problem Relation Age of Onset  . Diabetes Father   . Heart disease Father   . Hypertension Father   . Hyperlipidemia Father   . Diabetes Mother   . Hypertension Mother   . Dementia Mother        senile  . Heart attack Mother 40  . Diabetes Brother   . Heart disease Brother   . Heart disease Maternal Grandmother   . Colon cancer Neg Hx   . Colon polyps Neg Hx   . Esophageal cancer Neg Hx   . Kidney disease Neg Hx   . Gallbladder disease Neg Hx     Social  History   Socioeconomic History  . Marital status: Single    Spouse name: Not on file  . Number of children: 1  . Years of education: Not on file  . Highest education level: Not on file  Occupational History  . Occupation: Press photographer  Social Needs  . Financial resource strain: Not on file  . Food insecurity    Worry: Not on file    Inability: Not on file  . Transportation needs    Medical: Not on file    Non-medical: Not on file  Tobacco Use  . Smoking status: Never Smoker  . Smokeless tobacco: Never Used  Substance and Sexual Activity  . Alcohol use: Yes    Alcohol/week: 0.0 standard drinks    Comment: Rarely  . Drug use: No  . Sexual activity: Yes    Birth control/protection: Condom  Lifestyle  . Physical activity    Days per week: Not on file    Minutes per session: Not on file  . Stress: Not on file  Relationships  . Social Herbalist on phone: Not on file    Gets together: Not on file    Attends religious service: Not on file    Active member  of club or organization: Not on file    Attends meetings of clubs or organizations: Not on file    Relationship status: Not on file  . Intimate partner violence    Fear of current or ex partner: Not on file    Emotionally abused: Not on file    Physically abused: Not on file    Forced sexual activity: Not on file  Other Topics Concern  . Not on file  Social History Narrative  . Not on file    Review of Systems: ALL ROS discussed and all others negative except listed in HPI. Non-pruritic rash on her elbow that has been there for 2 years. No extra-GI manifestations of IBD.   Physical Exam: Complete physical exam not performed due to the limits inherent in a telehealth encounter.  General: Awake, alert, and oriented, and well communicative. In no acute distress.  HEENT: EOMI, non-icteric sclera, NCAT, MMM  Neck: Normal movement of head and neck  Pulm: No labored breathing, speaking in full sentences without  conversational dyspnea  Derm: No apparent lesions or bruising in visible field  MS: Moves all visible extremities without noticeable abnormality  Psych: Pleasant, cooperative, normal speech, normal affect and normal insight Neuro: Alert and appropriate   Aamari Strawderman L. Tarri Glenn, MD, MPH [facility] 07/11/2018, 1:56 PM

## 2018-07-11 NOTE — Patient Instructions (Addendum)
I have recommended som additional labs to evaluate your history of anemia and to screen for inflammation.  We should proceed with colonoscopy. I will also recommend an upper endoscopy if the lab results show ongoing anemia.   I will try to get your results from Dr. Amedeo Plenty in the past.   Thank you for your patience with me and our technology today! Please stay home, safe, and healthy. I look forward to meeting you in person in the future.

## 2018-07-12 ENCOUNTER — Other Ambulatory Visit: Payer: BC Managed Care – PPO

## 2018-07-12 DIAGNOSIS — K921 Melena: Secondary | ICD-10-CM

## 2018-07-12 DIAGNOSIS — R195 Other fecal abnormalities: Secondary | ICD-10-CM | POA: Diagnosis not present

## 2018-07-12 DIAGNOSIS — D509 Iron deficiency anemia, unspecified: Secondary | ICD-10-CM

## 2018-07-19 ENCOUNTER — Encounter: Payer: Self-pay | Admitting: *Deleted

## 2018-07-19 LAB — CALPROTECTIN, FECAL: Calprotectin, Fecal: 1578 ug/g — ABNORMAL HIGH (ref 0–120)

## 2018-07-28 ENCOUNTER — Encounter: Payer: Self-pay | Admitting: Gastroenterology

## 2018-08-03 ENCOUNTER — Telehealth: Payer: Self-pay | Admitting: Gastroenterology

## 2018-08-03 NOTE — Telephone Encounter (Signed)
No to all answers. °

## 2018-08-03 NOTE — Telephone Encounter (Signed)

## 2018-08-04 ENCOUNTER — Encounter: Payer: Self-pay | Admitting: Gastroenterology

## 2018-08-04 ENCOUNTER — Other Ambulatory Visit: Payer: Self-pay

## 2018-08-04 ENCOUNTER — Ambulatory Visit (AMBULATORY_SURGERY_CENTER): Payer: BC Managed Care – PPO | Admitting: Gastroenterology

## 2018-08-04 VITALS — BP 116/78 | HR 90 | Temp 99.2°F | Resp 15 | Ht 61.0 in | Wt 145.0 lb

## 2018-08-04 DIAGNOSIS — R195 Other fecal abnormalities: Secondary | ICD-10-CM

## 2018-08-04 DIAGNOSIS — K2980 Duodenitis without bleeding: Secondary | ICD-10-CM | POA: Diagnosis not present

## 2018-08-04 DIAGNOSIS — K921 Melena: Secondary | ICD-10-CM

## 2018-08-04 DIAGNOSIS — Z1211 Encounter for screening for malignant neoplasm of colon: Secondary | ICD-10-CM | POA: Diagnosis not present

## 2018-08-04 DIAGNOSIS — K602 Anal fissure, unspecified: Secondary | ICD-10-CM

## 2018-08-04 DIAGNOSIS — K2970 Gastritis, unspecified, without bleeding: Secondary | ICD-10-CM

## 2018-08-04 DIAGNOSIS — K5289 Other specified noninfective gastroenteritis and colitis: Secondary | ICD-10-CM

## 2018-08-04 DIAGNOSIS — K2951 Unspecified chronic gastritis with bleeding: Secondary | ICD-10-CM | POA: Diagnosis not present

## 2018-08-04 DIAGNOSIS — K269 Duodenal ulcer, unspecified as acute or chronic, without hemorrhage or perforation: Secondary | ICD-10-CM | POA: Diagnosis not present

## 2018-08-04 DIAGNOSIS — K529 Noninfective gastroenteritis and colitis, unspecified: Secondary | ICD-10-CM | POA: Diagnosis not present

## 2018-08-04 DIAGNOSIS — K2981 Duodenitis with bleeding: Secondary | ICD-10-CM | POA: Diagnosis not present

## 2018-08-04 DIAGNOSIS — K633 Ulcer of intestine: Secondary | ICD-10-CM

## 2018-08-04 MED ORDER — SODIUM CHLORIDE 0.9 % IV SOLN: 500.0000 mL | Freq: Once | INTRAVENOUS | Status: DC

## 2018-08-04 MED ORDER — PANTOPRAZOLE SODIUM 40 MG PO TBEC: 40.0000 mg | DELAYED_RELEASE_TABLET | Freq: Every day | ORAL | 0 refills | Status: DC

## 2018-08-04 MED ORDER — MESALAMINE 4 G RE ENEM: ENEMA | RECTAL | 0 refills | Status: DC

## 2018-08-04 NOTE — Patient Instructions (Signed)
YOU HAD AN ENDOSCOPIC PROCEDURE TODAY AT [facility]:   Refer to the procedure report that was given to you for any specific questions about what was found during the examination.  If the procedure report does not answer your questions, please call your gastroenterologist to clarify.  If you requested that your care partner not be given the details of your procedure findings, then the procedure report has been included in a sealed envelope for you to review at your convenience later.  YOU SHOULD EXPECT: Some feelings of bloating in the abdomen. Passage of more gas than usual.  Walking can help get rid of the air that was put into your GI tract during the procedure and reduce the bloating. If you had a lower endoscopy (such as a colonoscopy or flexible sigmoidoscopy) you may notice spotting of blood in your stool or on the toilet paper. If you underwent a bowel prep for your procedure, you may not have a normal bowel movement for a few days.  Please Note:  You might notice some irritation and congestion in your nose or some drainage.  This is from the oxygen used during your procedure.  There is no need for concern and it should clear up in a day or so.  SYMPTOMS TO REPORT IMMEDIATELY:   Following lower endoscopy (colonoscopy or flexible sigmoidoscopy):  Excessive amounts of blood in the stool  Significant tenderness or worsening of abdominal pains  Swelling of the abdomen that is new, acute  Fever of 100F or higher   Following upper endoscopy (EGD)  Vomiting of blood or coffee ground material  New chest pain or pain under the shoulder blades  Painful or persistently difficult swallowing  New shortness of breath  Fever of 100F or higher  Black, tarry-looking stools  For urgent or emergent issues, a gastroenterologist can be reached at any hour by calling [phone].   DIET:  We do recommend a small meal at first, but then you may proceed to your regular diet.  Drink  plenty of fluids but you should avoid alcoholic beverages for 24 hours.  ACTIVITY:  You should plan to take it easy for the rest of today and you should NOT DRIVE or use heavy machinery until tomorrow (because of the sedation medicines used during the test).    FOLLOW UP: Our staff will call the number listed on your records 48-72 hours following your procedure to check on you and address any questions or concerns that you may have regarding the information given to you following your procedure. If we do not reach you, we will leave a message.  We will attempt to reach you two times.  During this call, we will ask if you have developed any symptoms of COVID 19. If you develop any symptoms (ie: fever, flu-like symptoms, shortness of breath, cough etc.) before then, please call [phone].  If you test positive for Covid 19 in the 2 weeks post procedure, please call and report this information to [place].    If any biopsies were taken you will be contacted by phone or by letter within the next 1-3 weeks.  Please call us at [phone] if you have not heard about the biopsies in 3 weeks.    SIGNATURES/CONFIDENTIALITY: You and/or your care partner have signed paperwork which will be entered into your electronic medical record.  These signatures attest to the fact that that the information above on your After Visit Summary has been reviewed and is  understood.  Full responsibility of the confidentiality of this discharge information lies with you and/or your care-partner.

## 2018-08-04 NOTE — Op Note (Addendum)
[place] Patient Name: Ebony [name] Procedure Date: 08/04/2018 4:16 PM MRN: [id number] Endoscopist: Thornton Park MD, MD Age: 48 Referring MD:  Date of Birth: September 14, 1970 Gender: Female Account #: [id number] Procedure:                Colonoscopy Indications:              Blood and mucous in the stool                           Early satiety and bloating                           History of ulcerative colitis diagnosed in 1995                           -Previously followed by Dr. Amedeo Plenty and Dr. Olevia Perches                           -Treated with steroids and mesalamine: No prior                            biologic therapy                           -Previously required hospitalization and blood                            transfusion                           Microcytic anemia on labs from 2016                           History of colon polyps (per patient report) Medicines:                See the Anesthesia note for documentation of the                            administered medications Procedure:                Pre-Anesthesia Assessment:                           - Prior to the procedure, a History and Physical                            was performed, and patient medications and                            allergies were reviewed. The patient's tolerance of                            previous anesthesia was also reviewed. The risks                            and benefits of the procedure and the sedation  options and risks were discussed with the patient.                            All questions were answered, and informed consent                            was obtained. Prior Anticoagulants: The patient has                            taken no previous anticoagulant or antiplatelet                            agents. ASA Grade Assessment: II - A patient with                            mild systemic disease. After reviewing the risks                and benefits, the patient was deemed in                            satisfactory condition to undergo the procedure.                           After obtaining informed consent, the colonoscope                            was passed under direct vision. Throughout the                            procedure, the patient's blood pressure, pulse, and                            oxygen saturations were monitored continuously. The                            Colonoscope was introduced through the anus and                            advanced to the the terminal ileum, with                            identification of the appendiceal orifice and IC                            valve. A second forward view of the right colon was                            obtained. The colonoscopy was performed without                            difficulty. The patient tolerated the procedure                            well.  The quality of the bowel preparation was                            good. The terminal ileum, ileocecal valve,                            appendiceal orifice, and rectum were photographed. Scope In: 4:43:19 PM Scope Out: 4:57:18 PM Scope Withdrawal Time: 0 hours 11 minutes 0 seconds  Total Procedure Duration: 0 hours 13 minutes 59 seconds  Findings:                 An anal fissure was found on perianal exam.                           A continuous area of nonbleeding ulcerated mucosa                            with stigmata of recent bleeding was present from                            rectum to sigmoid colon. The ulceration terminates                            at 30 cm. Biopsies were taken with a cold forceps                            for histology. Estimated blood loss was minimal.                           The colon (entire examined portion) appeared                            normal. Biopsies were taken with a cold forceps for                            histology. Estimated blood loss  was minimal.                           The distal ileum appeared normal. Biopsies were                            taken with a cold forceps for histology. Estimated                            blood loss was minimal.                           The exam was otherwise without abnormality on                            direct and retroflexion views. Complications:            No immediate complications. Estimated blood loss:  Minimal. Estimated Blood Loss:     Estimated blood loss was minimal. Impression:               - Anal fissure found on perianal exam.                           - Mucosal ulceration in the rectum and distal colon                            suspicious for ulcerative colitis. Biopsied.                           - The entire examined colon is normal. Biopsied.                           - The examined portion of the ileum was normal.                            Biopsied.                           - The examination was otherwise normal on direct                            and retroflexion views. Recommendation:           - Patient has a contact number available for                            emergencies. The signs and symptoms of potential                            delayed complications were discussed with the                            patient. Return to normal activities tomorrow.                            Written discharge instructions were provided to the                            patient.                           - Resume regular diet today.                           - Continue present medications.                           - No aspirin, ibuprofen, naproxen, or other                            non-steroidal anti-inflammatory drugs.                           - Await pathology results.                           -  Use Rowasa enemas 1 per rectum BID for 2 weeks.                           - Follow-up in the clinic in 3-4 weeks, or earlier                             as needed Thornton Park MD, MD 08/04/2018 5:12:58 PM This report has been signed electronically.

## 2018-08-04 NOTE — Progress Notes (Signed)
A and O x3. Report to RN. Tolerated MAC anesthesia well.Teeth unchanged after procedure.

## 2018-08-04 NOTE — Op Note (Signed)
[place] Patient Name: Ebony [name] Procedure Date: 08/04/2018 4:16 PM MRN: [id number] Endoscopist: Thornton Park MD, MD Age: 48 Referring MD:  Date of Birth: Oct 01, 1970 Gender: Female Account #: [id number] Procedure:                Upper GI endoscopy Indications:              Abdominal bloating Medicines:                See the Anesthesia note for documentation of the                            administered medications Procedure:                Pre-Anesthesia Assessment:                           - Prior to the procedure, a History and Physical                            was performed, and patient medications and                            allergies were reviewed. The patient's tolerance of                            previous anesthesia was also reviewed. The risks                            and benefits of the procedure and the sedation                            options and risks were discussed with the patient.                            All questions were answered, and informed consent                            was obtained. Prior Anticoagulants: The patient has                            taken no previous anticoagulant or antiplatelet                            agents. ASA Grade Assessment: II - A patient with                            mild systemic disease. After reviewing the risks                            and benefits, the patient was deemed in                            satisfactory condition to undergo the procedure.  After obtaining informed consent, the endoscope was                            passed under direct vision. Throughout the                            procedure, the patient's blood pressure, pulse, and                            oxygen saturations were monitored continuously. The                            Endoscope was introduced through the mouth, and                            advanced to the third part of  duodenum. The upper                            GI endoscopy was accomplished without difficulty.                            The patient tolerated the procedure well. Scope In: Scope Out: Findings:                 The examined esophagus was normal. Biopsies were                            taken from the upper, middle, and lower esophagus                            with a cold forceps for histology. Estimated blood                            loss was minimal.                           Multiple localized, medium non-bleeding erosions                            were found in the gastric body and in the gastric                            antrum. Some erosion were linear. There were no                            stigmata of recent bleeding. Biopsies were taken                            with a cold forceps for histology from the antrum,                            body, and fundus. Estimated blood loss was minimal.  Multiple erosions without bleeding were found in                            the duodenal bulb and the duodenal sweep. Biopsies                            were taken with a cold forceps for histology.                            Estimated blood loss was minimal.                           The exam was otherwise without abnormality. Complications:            No immediate complications. Estimated blood loss:                            Minimal. Estimated Blood Loss:     Estimated blood loss was minimal. Impression:               - Normal esophagus. Biopsied.                           - Non-bleeding erosive gastropathy. Biopsied.                           - Duodenal erosions without bleeding. Biopsied.                           - The examination was otherwise normal. Recommendation:           - Patient has a contact number available for                            emergencies. The signs and symptoms of potential                            delayed complications were  discussed with the                            patient. Return to normal activities tomorrow.                            Written discharge instructions were provided to the                            patient.                           - Resume regular diet.                           - No aspirin, ibuprofen, naproxen, or other                            non-steroidal anti-inflammatory drugs.                           -  Await pathology results.                           - Use Protonix (pantoprazole) 40 mg PO daily for 8                            weeks. Thornton Park MD, MD 08/04/2018 5:06:14 PM This report has been signed electronically.

## 2018-08-08 ENCOUNTER — Telehealth: Payer: Self-pay

## 2018-08-08 NOTE — Telephone Encounter (Signed)
First attempt follow up call to pt, left message to call if any problems or covid 19 sx or exposure, otherwise we will call her later this afternoon.

## 2018-08-08 NOTE — Telephone Encounter (Signed)
  Follow up Call-  Call back number 08/04/2018  Post procedure Call Back phone  # [phone]  Permission to leave phone message Yes  Some recent data might be hidden     Patient questions:  Do you have a fever, pain , or abdominal swelling? No. Pain Score  0 *  Have you tolerated food without any problems? Yes.    Have you been able to return to your normal activities? Yes.    Do you have any questions about your discharge instructions: Diet   No. Medications  No. Follow up visit  No.  Do you have questions or concerns about your Care? No.  Actions: * If pain score is 4 or above: No action needed, pain <4.  1. Have you developed a fever since your procedure? no  2.   Have you had an respiratory symptoms (SOB or cough) since your procedure? no  3.   Have you tested positive for COVID 19 since your procedure no  4.   Have you had any family members/close contacts diagnosed with the COVID 19 since your procedure?  no   If yes to any of these questions please route to Joylene John, RN and Alphonsa Gin, Therapist, sports.

## 2018-08-28 NOTE — Progress Notes (Signed)
Referring Provider: [facility],* Primary Care Physician:  [facility], [place]    Chief complaint: Bloating, blood in the stool   IMPRESSION:  Distal ulcerative colitis presenting with blood and mucous in the stool    - initially diagnosed in 1995    - Previously followed by Dr. Amedeo Plenty and Dr. Olevia Perches    - Treated with steroids and mesalamine: No prior biologic therapy    - Previously required hospitalization and blood transfusion    - Labs 06/2018: fecal calprotectin 1578, ESR 29, CRP <1 Gastric and duodenal erosions likely due to NSAIDs Iron deficiency anemia    - labs 07/11/18: iron 32, ferritin 5.1, hemoglobin 11.7, MCV 82, RDW 14.9 History of colon polyps (per patient report) Posterior anal fissure Vitamin D deficiency  Recent endoscopy findings may be consistent with history of distal UC. Location is atypical for SCAD. She only rarely uses NSAIDs. She does not like Rowasa enemas and would prefer oral therapy.  Trial of Lialda 4.8 g daily with goal of tapering to 2.4 g daily as symptoms improve.  May substitute with another once daily mesalamine product if this is not approved by her insurance company.  She will use Rowasa enemas nightly as needed for symptoms not controlled by Lialda.  Will use serial fecal calprotectin to monitor her response to therapy.  Early satiety and bloating is responding to PPI therapy and is likely due to NSAID-related gastric and duodenal erosions.   Iron deficiency anemia is likely due to active colitis.  Continue oral iron.  PLAN: Trial of Lialda 4.8 g daily, with goal of tapering to 2.4 g daily as symptoms improve Rowasa enemas QHS as needed Avoid all NSAIDs Continue daily iron supplements Daily calcium 1200 daily and Vit D 800 IU daily Complete 8 weeks of pantoprazole Return in 3 months, repeat fecal calprotectin, ESR, and CRP 2 weeks before her office visit Establish care with a dermatologist to evaluate the lesions on the  chest and left elbow   HPI: Ebony [name] is a 48 y.o. female under evaluation for bloating and blood in the stool.  The interval history is obtained through the patient and review of her electronic health record.  She was previously seen by Dr. Olevia Perches in 2016. Follows a vegetarian diet. She does not have a PCP. Her GYN is at [facility] in [place].   EGD and Colonoscopy performed 08/04/18 showed a normal esophagus, multiple gastric erosions, and duodenal bulb erosions. Biopsies confirmed peptic duodenitis, mild chronic gastritis without H pylori, and normal esophageal biopsies.  Colonoscopy revealed a posterior anal fissure, distal colon colitis from the anal verge to 30 cm.  Biopsies confirmed active colitis without granulomas. The remainder of the colon and distal terminal ileum was normal endoscopically and histologically. The distal terminal ileum appeared normal.   Trial of Rowasa enemas recommended for the colitis. She has had difficulty with the enemas. She does not like them and would like to consider alternatives. She continues to have some diarrhea. Continues to have a significant amount of mucous. Less blood.   PPI has nearly resolved her bloating.   Symptoms are worse in the morning. 4-5 early morning BM daily within the first 1-2 hours of waking. Occasional BM throughout the rest of the day.  Energy is improved on multivitamin with iron. Doesn't take it daily because of associated nausea. Appetite is good. Unintentional weight gain.   Occassional NSAIDs for headaches.   Continues to have a nonpruritus, nonpainful rash  on her left elbow. Two spots on the chest.   Prior imaging studies include: Abdominal ultrasound June 2001: Normal Barium esophagram in September 2001: small sliding hiatal hernia, otherwise unremarkable  Recent labs: 07/11/2018: Iron 32, ferritin 5.1, CRP less than 1.0, sedimentation rate 29. hemoglobin 11.7, MCV 82, RDW 14.9, platelets 380.  Fecal calprotectin  1578.   Past Medical History:  Diagnosis Date  . Anemia   . Arthritis   . Blood transfusion without reported diagnosis unknown   for tx uc  . Colon polyps 1997 or 1998?  Marland Kitchen GI bleeding   . Ulcerative colitis [facility])     Past Surgical History:  Procedure Laterality Date  . COLONOSCOPY  1997 or 1998?    Current Outpatient Medications  Medication Sig Dispense Refill  . Flaxseed, Linseed, (FLAXSEED OIL PO) Take 1 tablet by mouth as needed.    . mesalamine (ROWASA) 4 g enema Use enema 1 per rectum twice daily for 2 weeks 28 enema 0  . OVER THE COUNTER MEDICATION Take 1 tablet by mouth as needed. Pt taking Tumeric    . pantoprazole (PROTONIX) 40 MG tablet Take 1 tablet (40 mg total) by mouth daily. 60 tablet 0  . Probiotic Product (PROBIOTIC DAILY PO) Take 1 tablet by mouth as needed.    . traMADol (ULTRAM) 50 MG tablet Take 1 tablet (50 mg total) by mouth every 6 (six) hours as needed. 30 tablet 0   Current Facility-Administered Medications  Medication Dose Route Frequency Provider Last Rate Last Dose  . 0.9 %  sodium chloride infusion  500 mL Intravenous Once Thornton Park, MD        Allergies as of 08/29/2018  . (No Known Allergies)    Family History  Problem Relation Age of Onset  . Diabetes Father   . Heart disease Father   . Hypertension Father   . Hyperlipidemia Father   . Diabetes Mother   . Hypertension Mother   . Dementia Mother        senile  . Heart attack Mother 22  . Diabetes Brother   . Heart disease Brother   . Heart disease Maternal Grandmother   . Colon cancer Neg Hx   . Colon polyps Neg Hx   . Esophageal cancer Neg Hx   . Kidney disease Neg Hx   . Gallbladder disease Neg Hx     Social History   Socioeconomic History  . Marital status: Single    Spouse name: Not on file  . Number of children: 1  . Years of education: Not on file  . Highest education level: Not on file  Occupational History  . Occupation: Press photographer  Social Needs  .  Financial resource strain: Not on file  . Food insecurity    Worry: Not on file    Inability: Not on file  . Transportation needs    Medical: Not on file    Non-medical: Not on file  Tobacco Use  . Smoking status: Never Smoker  . Smokeless tobacco: Never Used  Substance and Sexual Activity  . Alcohol use: Yes    Alcohol/week: 0.0 standard drinks    Comment: Rarely  . Drug use: No  . Sexual activity: Yes    Birth control/protection: Condom  Lifestyle  . Physical activity    Days per week: Not on file    Minutes per session: Not on file  . Stress: Not on file  Relationships  . Social Herbalist on  phone: Not on file    Gets together: Not on file    Attends religious service: Not on file    Active member of club or organization: Not on file    Attends meetings of clubs or organizations: Not on file    Relationship status: Not on file  . Intimate partner violence    Fear of current or ex partner: Not on file    Emotionally abused: Not on file    Physically abused: Not on file    Forced sexual activity: Not on file  Other Topics Concern  . Not on file  Social History Narrative  . Not on file    Review of Systems: ALL ROS discussed and all others negative except listed in HPI. Non-pruritic rash on her elbow that has been there for 2 years. There are two lesions on the chest wall. No extra-GI manifestations of IBD.   Physical Exam: Complete physical exam not performed due to the limits inherent in a telehealth encounter.  General: Awake, alert, and oriented, and well communicative. In no acute distress.  HEENT: EOMI, non-icteric sclera, NCAT, MMM  Neck: Normal movement of head and neck  Pulm: No labored breathing, speaking in full sentences without conversational dyspnea  Derm: No apparent lesions or bruising in visible field  MS: Moves all visible extremities without noticeable abnormality  Psych: Pleasant, cooperative, normal speech, normal affect and normal  insight Neuro: Alert and appropriate   Richell Corker L. Tarri Glenn, MD, MPH [facility] 08/28/2018, 5:12 PM

## 2018-08-29 ENCOUNTER — Other Ambulatory Visit: Payer: Self-pay

## 2018-08-29 ENCOUNTER — Encounter: Payer: Self-pay | Admitting: Gastroenterology

## 2018-08-29 ENCOUNTER — Ambulatory Visit: Payer: BC Managed Care – PPO | Admitting: Gastroenterology

## 2018-08-29 ENCOUNTER — Encounter: Payer: Self-pay | Admitting: Emergency Medicine

## 2018-08-29 VITALS — BP 110/78 | HR 78 | Temp 98.8°F | Ht 61.0 in | Wt 153.0 lb

## 2018-08-29 DIAGNOSIS — K51311 Ulcerative (chronic) rectosigmoiditis with rectal bleeding: Secondary | ICD-10-CM | POA: Diagnosis not present

## 2018-08-29 MED ORDER — MESALAMINE 1.2 G PO TBEC: 4.8000 g | DELAYED_RELEASE_TABLET | Freq: Every day | ORAL | 3 refills | Status: DC

## 2018-08-29 NOTE — Patient Instructions (Addendum)
We have sent the following medications to your pharmacy for you to pick up at your convenience: lialda 4.8 grams daily   Rowasa enemas at bedtime as needed   Avoid all NSAIDs  Complete 8 weeks of Pantoprazole   Return to clinic in 3 weeks with lab work before visit.

## 2018-10-24 ENCOUNTER — Ambulatory Visit: Payer: BC Managed Care – PPO | Admitting: Family Medicine

## 2018-11-01 DIAGNOSIS — L570 Actinic keratosis: Secondary | ICD-10-CM | POA: Diagnosis not present

## 2018-11-01 DIAGNOSIS — L920 Granuloma annulare: Secondary | ICD-10-CM | POA: Diagnosis not present

## 2018-11-01 DIAGNOSIS — C44519 Basal cell carcinoma of skin of other part of trunk: Secondary | ICD-10-CM | POA: Diagnosis not present

## 2018-11-07 DIAGNOSIS — C44519 Basal cell carcinoma of skin of other part of trunk: Secondary | ICD-10-CM | POA: Diagnosis not present

## 2018-11-13 ENCOUNTER — Telehealth: Payer: Self-pay | Admitting: Obstetrics and Gynecology

## 2018-11-13 ENCOUNTER — Telehealth: Payer: Self-pay

## 2018-11-13 DIAGNOSIS — Z1231 Encounter for screening mammogram for malignant neoplasm of breast: Secondary | ICD-10-CM

## 2018-11-13 NOTE — Telephone Encounter (Signed)
Order placed for mammo for that day. Have pt see if she can sched it now? Thx

## 2018-11-13 NOTE — Telephone Encounter (Signed)
Called and spoke with patient. Patient is aware of her appointment time and date at [facility] on Monday 01/01/2019 @ 10:20am

## 2018-11-13 NOTE — Telephone Encounter (Signed)
Patient scheduled AE w/[facility] 01/01/2019 at 8:10. Patient inquiring if we can get her an apt w/[facility] the same day (a few hours later). States [employer] will not allow her to set/make her own apt. Cb#[id number]

## 2018-11-14 NOTE — Telephone Encounter (Signed)
Kenney Houseman has scheduled appt for pt.

## 2018-11-16 ENCOUNTER — Ambulatory Visit (INDEPENDENT_AMBULATORY_CARE_PROVIDER_SITE_OTHER): Payer: BC Managed Care – PPO | Admitting: Family Medicine

## 2018-11-16 ENCOUNTER — Other Ambulatory Visit: Payer: Self-pay

## 2018-11-16 ENCOUNTER — Encounter: Payer: Self-pay | Admitting: Emergency Medicine

## 2018-11-16 ENCOUNTER — Encounter: Payer: Self-pay | Admitting: Family Medicine

## 2018-11-16 VITALS — BP 126/88 | HR 95 | Temp 97.1°F | Resp 16 | Ht 60.0 in | Wt 149.2 lb

## 2018-11-16 DIAGNOSIS — Z833 Family history of diabetes mellitus: Secondary | ICD-10-CM

## 2018-11-16 DIAGNOSIS — D500 Iron deficiency anemia secondary to blood loss (chronic): Secondary | ICD-10-CM | POA: Diagnosis not present

## 2018-11-16 DIAGNOSIS — Z8249 Family history of ischemic heart disease and other diseases of the circulatory system: Secondary | ICD-10-CM

## 2018-11-16 DIAGNOSIS — Z0001 Encounter for general adult medical examination with abnormal findings: Secondary | ICD-10-CM

## 2018-11-16 DIAGNOSIS — Z0000 Encounter for general adult medical examination without abnormal findings: Secondary | ICD-10-CM

## 2018-11-16 DIAGNOSIS — K51311 Ulcerative (chronic) rectosigmoiditis with rectal bleeding: Secondary | ICD-10-CM

## 2018-11-16 DIAGNOSIS — Z23 Encounter for immunization: Secondary | ICD-10-CM | POA: Diagnosis not present

## 2018-11-16 DIAGNOSIS — K5180 Other ulcerative colitis without complications: Secondary | ICD-10-CM | POA: Diagnosis not present

## 2018-11-16 DIAGNOSIS — R5383 Other fatigue: Secondary | ICD-10-CM | POA: Diagnosis not present

## 2018-11-16 LAB — COMPREHENSIVE METABOLIC PANEL
ALT: 11 U/L (ref 0–35)
AST: 15 U/L (ref 0–37)
Albumin: 4.1 g/dL (ref 3.5–5.2)
Alkaline Phosphatase: 55 U/L (ref 39–117)
BUN: 8 mg/dL (ref 6–23)
CO2: 27 mEq/L (ref 19–32)
Calcium: 9.1 mg/dL (ref 8.4–10.5)
Chloride: 107 mEq/L (ref 96–112)
Creatinine, Ser: 0.55 mg/dL (ref 0.40–1.20)
GFR: 118.99 mL/min (ref >60.00)
Glucose, Bld: 87 mg/dL (ref 70–99)
Potassium: 3.9 mEq/L (ref 3.5–5.1)
Sodium: 139 mEq/L (ref 135–145)
Total Bilirubin: 0.3 mg/dL (ref 0.2–1.2)
Total Protein: 6.7 g/dL (ref 6.0–8.3)

## 2018-11-16 LAB — CBC
HCT: 38 % (ref 36.0–46.0)
Hemoglobin: 12.3 g/dL (ref 12.0–15.0)
MCHC: 32.4 g/dL (ref 30.0–36.0)
MCV: 85.4 fl (ref 78.0–100.0)
Platelets: 321 10*3/uL (ref 150.0–400.0)
RBC: 4.45 Mil/uL (ref 3.87–5.11)
RDW: 15.3 % (ref 11.5–15.5)
WBC: 3.7 10*3/uL — ABNORMAL LOW (ref 4.0–10.5)

## 2018-11-16 LAB — LIPID PANEL
Cholesterol: 193 mg/dL (ref 0–200)
HDL: 54.1 mg/dL (ref >39.00)
LDL Cholesterol: 117 mg/dL — ABNORMAL HIGH (ref 0–99)
NonHDL: 138.79
Total CHOL/HDL Ratio: 4
Triglycerides: 110 mg/dL (ref 0.0–149.0)
VLDL: 22 mg/dL (ref 0.0–40.0)

## 2018-11-16 LAB — HEMOGLOBIN A1C: Hgb A1c MFr Bld: 5.7 % (ref 4.6–6.5)

## 2018-11-16 NOTE — Progress Notes (Signed)
Annual Exam   Chief Complaint:  Chief Complaint  Patient presents with  . Establish Care    no previous PCP but does see [facility]  . Headache    gets sinus headaches. Gets medication from gyn for migraines  . Fatigue    History of Present Illness:  [name] is a 48 y.o. G0P0000 who LMP was Patient's last menstrual period was 11/14/2018., presents today for her annual examination.    Fatigue - has been for 2 years - feels tired - not as active as she use to be - sleep- will fall asleep, but light sleeper and does not stay asleep - sleeps about 4 hours - wakes up to slight sounds - will then start running her mind and be awake for 30-60 minutes  Nutrition She does get adequate calcium and Vitamin D in her diet. Diet: appetite not great, eats one time per day, eats smalls meals Exercise: not anything regular  Safety The patient wears seatbelts: yes.     The patient feels safe at home and in their relationships: yes.   Menstrual Becoming more irregular - will skip 1-2 months. Heavy cycles with clotting.     GYN She is single partner, contraception - condoms most of the time.  No interest in testing  Cervical Cancer Screening:   Last Pap:  09/2016  Breast Cancer Screening There is no FH of breast cancer. There is no FH of ovarian cancer. BRCA screening Not Indicated.  Last Mammogram: 11/2017 The patient does want a mammogram this year.    Colon Cancer Screening Has UC and gets regular colonoscopy's  Weight Wt Readings from Last 3 Encounters:  11/16/18 149 lb 4 oz (67.7 kg)  08/29/18 153 lb (69.4 kg)  08/04/18 145 lb (65.8 kg)   Patient has high BMI  BMI Readings from Last 1 Encounters:  11/16/18 29.15 kg/m     Chronic disease screening Blood pressure monitoring:  BP Readings from Last 3 Encounters:  11/16/18 126/88  08/29/18 110/78  08/04/18 116/78    Lipid Monitoring: Indication for screening: age >12, obesity, diabetes, family  hx, CV risk factors.  Lipid screening: Yes  Lab Results  Component Value Date   CHOL 201 (H) 12/07/2017   HDL 69 12/07/2017   LDLCALC 111 (H) 12/07/2017   TRIG 106 12/07/2017   CHOLHDL 2.9 12/07/2017     Diabetes Screening: age >29, overweight, family hx, PCOS, hx of gestational diabetes, at risk ethnicity Diabetes Screening screening: Yes  Lab Results  Component Value Date   HGBA1C 5.0 09/23/2016     Past Medical History:  Diagnosis Date  . Anemia   . Arthritis   . Blood transfusion without reported diagnosis unknown   for tx uc  . Colon polyps 1997 or 1998?  Marland Kitchen GI bleeding   . Ulcerative colitis [facility])     Past Surgical History:  Procedure Laterality Date  . BASAL CELL CARCINOMA EXCISION  2020   left breast  . COLONOSCOPY  1997 or 1998?    Prior to Admission medications   Medication Sig Start Date End Date Taking? Authorizing Provider  augmented betamethasone dipropionate (DIPROLENE-AF) 0.05 % cream APPLY ON RASH TWICE DAILY AS NEEDED FOR FLARES 11/01/18  Yes [provider]  Flaxseed, Linseed, (FLAXSEED OIL PO) Take 1 tablet by mouth as needed.   Yes [provider]  mesalamine (LIALDA) 1.2 g EC tablet Take 4 tablets (4.8 g total) by mouth daily with breakfast. 08/29/18  Yes Thornton Park, MD  OVER THE COUNTER MEDICATION Take 1 tablet by mouth as needed. Pt taking Tumeric   Yes [provider]  pantoprazole (PROTONIX) 40 MG tablet Take 1 tablet (40 mg total) by mouth daily. 08/04/18  Yes Thornton Park, MD  Probiotic Product (PROBIOTIC DAILY PO) Take 1 tablet by mouth as needed.   Yes [provider]  traMADol (ULTRAM) 50 MG tablet Take 1 tablet (50 mg total) by mouth every 6 (six) hours as needed. 37/62/83  Yes Copland, Deirdre Evener, PA-C  mesalamine (ROWASA) 4 g enema Use enema 1 per rectum twice daily for 2 weeks Patient not taking: Reported on 11/16/2018 08/04/18   Thornton Park, MD    No Known Allergies  Gynecologic  History: Patient's last menstrual period was 11/14/2018.  Obstetric History: G0P0000  Social History   Socioeconomic History  . Marital status: Single    Spouse name: Not on file  . Number of children: 1  . Years of education: High school  . Highest education level: Not on file  Occupational History  . Occupation: Press photographer  Social Needs  . Financial resource strain: Not hard at all  . Food insecurity    Worry: Not on file    Inability: Not on file  . Transportation needs    Medical: Not on file    Non-medical: Not on file  Tobacco Use  . Smoking status: Never Smoker  . Smokeless tobacco: Never Used  Substance and Sexual Activity  . Alcohol use: Yes    Alcohol/week: 0.0 standard drinks    Comment: Rarely-about 2 times a year if that  . Drug use: No  . Sexual activity: Yes    Birth control/protection: Condom  Lifestyle  . Physical activity    Days per week: Not on file    Minutes per session: Not on file  . Stress: Not on file  Relationships  . Social Herbalist on phone: Not on file    Gets together: Not on file    Attends religious service: Not on file    Active member of club or organization: Not on file    Attends meetings of clubs or organizations: Not on file    Relationship status: Not on file  . Intimate partner violence    Fear of current or ex partner: Not on file    Emotionally abused: Not on file    Physically abused: Not on file    Forced sexual activity: Not on file  Other Topics Concern  . Not on file  Social History Narrative   11/16/18   From: the area   Living: son Ebony [name] and boyfriend Ebony [name] (15 yos)   Work: in Press photographer      Family: Has son Ebony [name] (24 yo), boyfriend      Enjoys: cooking for other people, walking/hiking      Exercise: not regular routine currently   Diet: small meals, only once per day      Safety   Seat belts: Yes    Guns: Yes  and secure   Safe in relationships: Yes     Family History  Problem Relation Age of  Onset  . Diabetes Father   . Heart disease Father   . Hypertension Father   . Hyperlipidemia Father   . Heart attack Father 88  . Other Father        heart bypass and heart transplant  . Diabetes Mother   . Hypertension Mother   .  Dementia Mother        senile  . Heart attack Mother 66  . Diabetes Brother   . Heart disease Brother   . Heart disease Maternal Grandmother   . Other Paternal Grandmother        carotid blockage  . Colon cancer Neg Hx   . Colon polyps Neg Hx   . Esophageal cancer Neg Hx   . Kidney disease Neg Hx   . Gallbladder disease Neg Hx     Review of Systems  Constitutional: Positive for malaise/fatigue. Negative for chills and fever.  HENT: Positive for congestion.   Eyes: Negative for blurred vision and double vision.  Respiratory: Positive for shortness of breath.   Cardiovascular: Negative for chest pain.  Gastrointestinal: Positive for blood in stool. Negative for diarrhea.  Genitourinary: Negative.   Musculoskeletal: Negative.   Skin: Negative for rash.  Neurological: Positive for headaches.  Endo/Heme/Allergies: Does not bruise/bleed easily.  Psychiatric/Behavioral: Negative for depression. The patient is not nervous/anxious.      Physical Exam BP 126/88   Pulse 95   Temp (!) 97.1 F (36.2 C)   Resp 16   Ht 5' (1.524 m)   Wt 149 lb 4 oz (67.7 kg)   LMP 11/14/2018   SpO2 97%   BMI 29.15 kg/m    BP Readings from Last 3 Encounters:  11/16/18 126/88  08/29/18 110/78  08/04/18 116/78      Physical Exam Constitutional:      General: She is not in acute distress.    Appearance: She is well-developed. She is not diaphoretic.  HENT:     Head: Normocephalic and atraumatic.     Right Ear: External ear normal.     Left Ear: External ear normal.     Nose: Nose normal.  Eyes:     General: No scleral icterus.    Conjunctiva/sclera: Conjunctivae normal.  Neck:     Musculoskeletal: Neck supple.  Cardiovascular:     Rate and Rhythm:  Normal rate and regular rhythm.     Heart sounds: No murmur.  Pulmonary:     Effort: Pulmonary effort is normal. No respiratory distress.     Breath sounds: Normal breath sounds. No wheezing.  Abdominal:     General: Bowel sounds are normal. There is no distension.     Palpations: Abdomen is soft. There is no mass.     Tenderness: There is abdominal tenderness in the left lower quadrant. There is no guarding or rebound.  Musculoskeletal: Normal range of motion.  Lymphadenopathy:     Cervical: No cervical adenopathy.  Skin:    General: Skin is warm and dry.     Capillary Refill: Capillary refill takes less than 2 seconds.  Neurological:     Mental Status: She is alert and oriented to person, place, and time.     Deep Tendon Reflexes: Reflexes normal.  Psychiatric:        Behavior: Behavior normal.        Results:  PHQ-9:    Office Visit from 11/16/2018 in [place] at [facility]  PHQ-9 Total Score  3    low risk    Assessment: 48 y.o. G0P0000 Ebony here for routine annual physical examination.  Plan: Problem List Items Addressed This Visit      Digestive   Ulcerative colitis ([place])    Follows with Dr. Tarri Glenn        Other   Family history of MI (myocardial infarction)  Relevant Orders   Comprehensive metabolic panel   Lipid panel   Hemoglobin A1c   Iron deficiency anemia due to chronic blood loss   Relevant Orders   CBC   Other fatigue    Will get blood work. Suspect it is multifactorial with low nutrition, poor sleep, and hx of anemia w/ on going blood loss. Sleep hygiene discussed. Advised monitoring intake and increasing calories. Encouraged iron tablet and discussing menorrhagia with GYN at upcoming appointment.       Relevant Orders   CBC   TSH   Ferritin   Vitamin D, 25-hydroxy    Other Visit Diagnoses    Annual visit for general adult medical examination with abnormal findings    -  Primary   Family history of diabetes mellitus        Need for Tdap vaccination       Relevant Orders   Tdap vaccine greater than or equal to 7yo IM (Completed)   Need for influenza vaccination       Relevant Orders   Flu Vaccine QUAD 6+ mos PF IM (Fluarix Quad PF) (Completed)      Screening: -- Blood pressure screen normal -- cholesterol screening: will obtain -- Weight screening: overweight: continue to monitor -- Diabetes Screening: will obtain -- Nutrition: normal   The 10-year ASCVD risk score Mikey Bussing DC Jr., et al., 2013) is: 0.6%   Values used to calculate the score:     Age: 4 years     Sex: Ebony     Is Non-Hispanic African American: No     Diabetic: No     Tobacco smoker: No     Systolic Blood Pressure: 071 mmHg     Is BP treated: No     HDL Cholesterol: 69 mg/dL     Total Cholesterol: 201 mg/dL  -- Statin therapy for Age 48-75 with CVD risk >7.5%  Psych -- Depression screening (PHQ-9):    Office Visit from 11/16/2018 in [place] at [facility]  PHQ-9 Total Score  3       Safety -- tobacco screening: not using -- alcohol screening:  low-risk usage. -- no evidence of domestic violence or intimate partner violence.   Cancer Screening -- pap smear up to date per ASCCP guidelines -- family history of breast cancer screening: done. not at high risk. -- Mammogram - up to date  Immunizations -- flu vaccine given to day -- TDAP q10 years given today     Lesleigh Noe, MD

## 2018-11-16 NOTE — Patient Instructions (Signed)
#  Fatigue - Labs today - Talk to GYN about your periods - Try to get more sleep - Try counting calories and try to get 1500 calories/day - consider trying a supplement shake if needed - try to make a point to walk a few times per week   Medications: Melatonin 3-5 mg   Try Ear Plugs at night    Sleep hygiene checklist: 1. Avoid naps during the day 2. Avoid stimulants such as caffeine and nicotine. Avoid bedtime alcohol (it can speed onset of sleep but the body's metabolism can cause awakenings). At least 2 hours before bedtime 3. All forms of exercise help ensure sound sleep - limit vigorous exercise to morning or late afternoon 4. Avoid food too close to bedtime including chocolate (which contains caffeine) 5. Soak up natural light 6. Establish regular bedtime routine. 7. Associate bed with sleep - avoid TV, computer or phone, reading while in bed. 8. Ensure pleasant, relaxing sleep environment - quiet, dark, cool room.  Good Sleep Hygiene Habits -- Got to bed and wake up within an hour of the same time every day -- Avoid bright screens (from laptop, phone, TV) within at least 30 minutes before bed. The "blue light" supresses the sleep hormone melatonin and the content may stimulate as well -- Maintain a quiet and dark sleep environment (blackout curtains, turn on a fan or white noise to block out disruptive sounds) -- Practicing relaxing activites before bed (taking a shower, reading a book, journaling, meditation app) -- To quiet a busy mind -- consider journaling before bed (jotting down reminders, worry thoughts, as well as positive things like a gratitude list)   Begin a Mindfulness/Meditation practice -- this can take a little as 3 minutes -- You can find resources in books -- Or you can download apps like  ---- Headspace App (which currently has free content called "Weathering the Storm") ---- Calm (which has a few free options)  ---- Insignt Timer ---- Stop, Breathe &  Think  # With each of these Apps - you should decline the "start free trial" offer and as you search through the App should be able to access some of their free content. You can also chose to pay for the content if you find one that works well for you.   # Many of them also offer sleep specific content which may help with insomnia

## 2018-11-16 NOTE — Assessment & Plan Note (Signed)
Will get blood work. Suspect it is multifactorial with low nutrition, poor sleep, and hx of anemia w/ on going blood loss. Sleep hygiene discussed. Advised monitoring intake and increasing calories. Encouraged iron tablet and discussing menorrhagia with GYN at upcoming appointment.

## 2018-11-16 NOTE — Assessment & Plan Note (Signed)
Follows with Dr. Tarri Glenn

## 2018-11-17 ENCOUNTER — Encounter: Payer: Self-pay | Admitting: Family Medicine

## 2018-11-17 ENCOUNTER — Telehealth: Payer: Self-pay | Admitting: Gastroenterology

## 2018-11-17 LAB — VITAMIN D 25 HYDROXY (VIT D DEFICIENCY, FRACTURES): VITD: 36.4 ng/mL (ref 30.00–100.00)

## 2018-11-17 LAB — TSH: TSH: 2.08 u[IU]/mL (ref 0.35–4.50)

## 2018-11-17 LAB — FERRITIN: Ferritin: 4.9 ng/mL — ABNORMAL LOW (ref 10.0–291.0)

## 2018-11-17 NOTE — Telephone Encounter (Signed)
Ebony [name], pt stated that she received a message from you on MyChart about an appointment or lab work.

## 2018-11-20 NOTE — Telephone Encounter (Signed)
Pt scheduled for 01-11-2019 due to availability, will have lab work done before.

## 2018-11-30 ENCOUNTER — Other Ambulatory Visit: Payer: Self-pay | Admitting: Gastroenterology

## 2018-12-27 ENCOUNTER — Other Ambulatory Visit: Payer: Self-pay | Admitting: Gastroenterology

## 2018-12-27 DIAGNOSIS — L918 Other hypertrophic disorders of the skin: Secondary | ICD-10-CM | POA: Diagnosis not present

## 2018-12-27 DIAGNOSIS — D225 Melanocytic nevi of trunk: Secondary | ICD-10-CM | POA: Diagnosis not present

## 2018-12-27 DIAGNOSIS — D2271 Melanocytic nevi of right lower limb, including hip: Secondary | ICD-10-CM | POA: Diagnosis not present

## 2018-12-27 DIAGNOSIS — Z85828 Personal history of other malignant neoplasm of skin: Secondary | ICD-10-CM | POA: Diagnosis not present

## 2018-12-29 ENCOUNTER — Other Ambulatory Visit: Payer: BC Managed Care – PPO

## 2018-12-29 DIAGNOSIS — K51311 Ulcerative (chronic) rectosigmoiditis with rectal bleeding: Secondary | ICD-10-CM

## 2018-12-31 NOTE — Progress Notes (Signed)
PCP:  Lesleigh Noe, MD   Chief Complaint  Patient presents with  . Gynecologic Exam     HPI:      [name] is a 48 y.o. G0P0000 who LMP was Patient's last menstrual period was 12/29/2018 (exact date)., presents today for her annual examination.  Her menses are every 1-2 months, lasting 5 days.  Dysmenorrhea mild, occurring first 1-2 days of flow. She has had infrequent 1 day light spotting mid cycle. Has menstrual migraines only. No sx other times of cycle. Takes tramadol sparingly with some relief. Needs Rx RF. Having occas vasomotor sx.   Sex activity: single partner, contraception - condoms, declines other BC.  Last Pap: September 23, 2016  Results were: no abnormalities /neg HPV DNA  Hx of STDs: none  Last mammogram: 12/07/17  Results were: normal--routine follow-up in 12 months. Has mammo sched today. There is no FH of breast cancer. There is no FH of ovarian cancer. The patient does do self-breast exams.  Tobacco use: The patient denies current or previous tobacco use. Alcohol use: none No drug use.  Exercise: moderately active  She does get adequate calcium and Vitamin D in her diet. Labs with PCP 10/20. Strong FH CAD on both sides.  Pt with hx of ulcerative colitis. Seeing GI.   Has noted a painful area RT low back for past wk. Noticed after a fall on LT hip last wk. Hurts with some movements and to touch.   Past Medical History:  Diagnosis Date  . Anemia   . Arthritis   . Blood transfusion without reported diagnosis unknown   for tx uc  . Colon polyps 1997 or 1998?  [facility]land Kitchen GI bleeding   . Ulcerative colitis [facility])     Past Surgical History:  Procedure Laterality Date  . BASAL CELL CARCINOMA EXCISION  2020   left breast  . COLONOSCOPY  1997 or 1998?    Family History  Problem Relation Age of Onset  . Diabetes Father   . Heart disease Father   . Hypertension Father   . Hyperlipidemia Father   . Heart attack Father 5  . Other Father    heart bypass and heart transplant  . Diabetes Mother   . Hypertension Mother   . Dementia Mother        senile  . Heart attack Mother 55  . Diabetes Brother   . Heart disease Brother   . Heart disease Maternal Grandmother   . Other Paternal Grandmother        carotid blockage  . Colon cancer Neg Hx   . Colon polyps Neg Hx   . Esophageal cancer Neg Hx   . Kidney disease Neg Hx   . Gallbladder disease Neg Hx     Social History   Socioeconomic History  . Marital status: Single    Spouse name: Not on file  . Number of children: 1  . Years of education: High school  . Highest education level: Not on file  Occupational History  . Occupation: Sales  Tobacco Use  . Smoking status: Never Smoker  . Smokeless tobacco: Never Used  Substance and Sexual Activity  . Alcohol use: Yes    Alcohol/week: 0.0 standard drinks    Comment: Rarely-about 2 times a year if that  . Drug use: No  . Sexual activity: Yes    Birth control/protection: Condom  Other Topics Concern  . Not on file  Social History Narrative  11/16/18   From: the area   Living: son tyler and boyfriend Dominica Severin (15 yos)   Work: in Press photographer      Family: Has son Dorothea Ogle (31 yo), boyfriend      Enjoys: cooking for other people, walking/hiking      Exercise: not regular routine currently   Diet: small meals, only once per day      Safety   Seat belts: Yes    Guns: Yes  and secure   Safe in relationships: Yes    Social Determinants of Health   Financial Resource Strain: Low Risk   . Difficulty of Paying Living Expenses: Not hard at all  Food Insecurity:   . Worried About Charity fundraiser in the Last Year: Not on file  . Ran Out of Food in the Last Year: Not on file  Transportation Needs:   . Lack of Transportation (Medical): Not on file  . Lack of Transportation (Non-Medical): Not on file  Physical Activity:   . Days of Exercise per Week: Not on file  . Minutes of Exercise per Session: Not on file  Stress:   .  Feeling of Stress : Not on file  Social Connections:   . Frequency of Communication with Friends and Family: Not on file  . Frequency of Social Gatherings with Friends and Family: Not on file  . Attends Religious Services: Not on file  . Active Member of Clubs or Organizations: Not on file  . Attends Archivist Meetings: Not on file  . Marital Status: Not on file  Intimate Partner Violence:   . Fear of Current or Ex-Partner: Not on file  . Emotionally Abused: Not on file  . Physically Abused: Not on file  . Sexually Abused: Not on file    Outpatient Medications Prior to Visit  Medication Sig Dispense Refill  . augmented betamethasone dipropionate (DIPROLENE-AF) 0.05 % cream APPLY ON RASH TWICE DAILY AS NEEDED FOR FLARES    . Flaxseed, Linseed, (FLAXSEED OIL PO) Take 1 tablet by mouth as needed.    . mesalamine (LIALDA) 1.2 g EC tablet Take 4 tablets (4.8 g total) by mouth daily with breakfast. 120 tablet 3  . mesalamine (ROWASA) 4 g enema USE 1 ENEMA PER RECTUM TWICE DAILY FOR 2 WEEKS 30 enema 1  . OVER THE COUNTER MEDICATION Take 1 tablet by mouth as needed. Pt taking Tumeric    . pantoprazole (PROTONIX) 40 MG tablet Take 1 tablet by mouth once daily 60 tablet 3  . Probiotic Product (PROBIOTIC DAILY PO) Take 1 tablet by mouth as needed.    . traMADol (ULTRAM) 50 MG tablet Take 1 tablet (50 mg total) by mouth every 6 (six) hours as needed. 30 tablet 0   Facility-Administered Medications Prior to Visit  Medication Dose Route Frequency Provider Last Rate Last Admin  . 0.9 %  sodium chloride infusion  500 mL Intravenous Once Thornton Park, MD          ROS:  Review of Systems  Constitutional: Negative for fatigue, fever and unexpected weight change.  Respiratory: Negative for cough, shortness of breath and wheezing.   Cardiovascular: Negative for chest pain, palpitations and leg swelling.  Gastrointestinal: Negative for blood in stool, constipation, diarrhea, nausea  and vomiting.  Endocrine: Negative for cold intolerance, heat intolerance and polyuria.  Genitourinary: Negative for dyspareunia, dysuria, flank pain, frequency, genital sores, hematuria, menstrual problem, pelvic pain, urgency, vaginal bleeding, vaginal discharge and vaginal pain.  Musculoskeletal: Positive  for back pain. Negative for arthralgias, joint swelling and myalgias.  Skin: Negative for rash.  Neurological: Positive for headaches. Negative for dizziness, syncope, light-headedness and numbness.  Hematological: Negative for adenopathy.  Psychiatric/Behavioral: Negative for agitation, confusion, sleep disturbance and suicidal ideas. The patient is not nervous/anxious.   BREAST: No symptoms   Objective: BP 108/80   Ht 5' (1.524 m)   Wt 150 lb (68 kg)   LMP 12/29/2018 (Exact Date)   BMI 29.29 kg/m    Physical Exam Constitutional:      Appearance: She is well-developed.  Genitourinary:     Vulva, vagina, cervix, uterus, right adnexa and left adnexa normal.     No vulval lesion or tenderness noted.     No vaginal discharge, erythema or tenderness.     No cervical polyp.     Uterus is not enlarged or tender.     No right or left adnexal mass present.     Right adnexa not tender.     Left adnexa not tender.  Neck:     Thyroid: No thyromegaly.  Cardiovascular:     Rate and Rhythm: Normal rate and regular rhythm.     Heart sounds: Normal heart sounds. No murmur.  Pulmonary:     Effort: Pulmonary effort is normal.     Breath sounds: Normal breath sounds.  Chest:     Breasts:        Right: No mass, nipple discharge, skin change or tenderness.        Left: No mass, nipple discharge, skin change or tenderness.  Abdominal:     Palpations: Abdomen is soft.     Tenderness: There is no abdominal tenderness. There is no guarding.  Musculoskeletal:        General: Normal range of motion.     Cervical back: Normal range of motion.  Neurological:     General: No focal deficit  present.     Mental Status: She is alert and oriented to person, place, and time.     Cranial Nerves: No cranial nerve deficit.  Skin:    General: Skin is warm and dry.       Psychiatric:        Mood and Affect: Mood normal.        Behavior: Behavior normal.        Thought Content: Thought content normal.        Judgment: Judgment normal.  Vitals reviewed.     Assessment/Plan: Encounter for annual routine gynecological examination  Encounter for screening mammogram for malignant neoplasm of breast; Has mammo today  Mass of subcutaneous tissue of back - Plan: [place] Spine; check u/s. Will call with results. Question lipoma vs cyst.  Intractable menstrual migraine without status migrainosus - Rx RF tramadol. Use sparingly. Can discuss triptans with PCP - Plan: traMADol (ULTRAM) 50 MG tablet  Meds ordered this encounter  Medications  . traMADol (ULTRAM) 50 MG tablet    Sig: Take 1 tablet (50 mg total) by mouth every 6 (six) hours as needed.    Dispense:  30 tablet    Refill:  0    Order Specific Question:   Supervising Provider    Answer:   Gae Dry [[id number]]             GYN counsel breast self exam, mammography screening, adequate intake of calcium and vitamin D, diet and exercise     F/U  Return in about 1 year (around 01/01/2020).  Ebony Putt  B. Copland, PA-C 01/01/2019 10:06 AM

## 2019-01-01 ENCOUNTER — Encounter: Payer: Self-pay | Admitting: Obstetrics and Gynecology

## 2019-01-01 ENCOUNTER — Ambulatory Visit
Admission: RE | Admit: 2019-01-01 | Discharge: 2019-01-01 | Disposition: A | Discharge status: Home / Self Care | Payer: BC Managed Care – PPO | Source: Ambulatory Visit | Attending: Obstetrics and Gynecology | Admitting: Obstetrics and Gynecology

## 2019-01-01 ENCOUNTER — Other Ambulatory Visit: Payer: Self-pay

## 2019-01-01 ENCOUNTER — Ambulatory Visit (INDEPENDENT_AMBULATORY_CARE_PROVIDER_SITE_OTHER): Payer: BC Managed Care – PPO | Admitting: Obstetrics and Gynecology

## 2019-01-01 VITALS — BP 108/80 | Ht 60.0 in | Wt 150.0 lb

## 2019-01-01 DIAGNOSIS — Z1231 Encounter for screening mammogram for malignant neoplasm of breast: Secondary | ICD-10-CM

## 2019-01-01 DIAGNOSIS — Z01419 Encounter for gynecological examination (general) (routine) without abnormal findings: Secondary | ICD-10-CM | POA: Diagnosis not present

## 2019-01-01 DIAGNOSIS — G43839 Menstrual migraine, intractable, without status migrainosus: Secondary | ICD-10-CM

## 2019-01-01 DIAGNOSIS — R222 Localized swelling, mass and lump, trunk: Secondary | ICD-10-CM

## 2019-01-01 MED ORDER — TRAMADOL HCL 50 MG PO TABS: 50.0000 mg | ORAL_TABLET | Freq: Four times a day (QID) | ORAL | 0 refills | Status: DC | PRN

## 2019-01-01 NOTE — Patient Instructions (Signed)
I value your feedback and entrusting us with your care. If you get a [facility] patient survey, I would appreciate you taking the time to let us know about your experience today. Thank you!  As of December 28, 2018, your lab results will be released to your MyChart immediately, before I even have a chance to see them. Please give me time to review them and contact you if there are any abnormalities. Thank you for your patience.  

## 2019-01-03 ENCOUNTER — Other Ambulatory Visit: Payer: Self-pay

## 2019-01-03 ENCOUNTER — Ambulatory Visit (INDEPENDENT_AMBULATORY_CARE_PROVIDER_SITE_OTHER): Payer: BC Managed Care – PPO

## 2019-01-03 DIAGNOSIS — Z23 Encounter for immunization: Secondary | ICD-10-CM

## 2019-01-03 DIAGNOSIS — K5180 Other ulcerative colitis without complications: Secondary | ICD-10-CM | POA: Diagnosis not present

## 2019-01-03 NOTE — Progress Notes (Signed)
Per orders of Dr. Einar Pheasant (see recent mychart message)-co signed by Alma Friendly, NP in her absence injection of Pneumovax given by Kris Mouton. Patient tolerated injection well.

## 2019-01-04 LAB — CALPROTECTIN, FECAL: Calprotectin, Fecal: 1111 ug/g — ABNORMAL HIGH (ref 0–120)

## 2019-01-05 ENCOUNTER — Other Ambulatory Visit: Payer: Self-pay

## 2019-01-05 ENCOUNTER — Ambulatory Visit
Admission: RE | Admit: 2019-01-05 | Discharge: 2019-01-05 | Disposition: A | Discharge status: Home / Self Care | Payer: BC Managed Care – PPO | Source: Ambulatory Visit | Attending: Obstetrics and Gynecology | Admitting: Obstetrics and Gynecology

## 2019-01-05 DIAGNOSIS — R222 Localized swelling, mass and lump, trunk: Secondary | ICD-10-CM

## 2019-01-11 ENCOUNTER — Ambulatory Visit (INDEPENDENT_AMBULATORY_CARE_PROVIDER_SITE_OTHER): Payer: BC Managed Care – PPO | Admitting: Gastroenterology

## 2019-01-11 ENCOUNTER — Other Ambulatory Visit: Payer: Self-pay | Admitting: Gastroenterology

## 2019-01-11 ENCOUNTER — Encounter: Payer: Self-pay | Admitting: Gastroenterology

## 2019-01-11 VITALS — BP 108/80 | HR 93 | Temp 98.3°F | Ht 60.0 in | Wt 149.5 lb

## 2019-01-11 DIAGNOSIS — R195 Other fecal abnormalities: Secondary | ICD-10-CM

## 2019-01-11 DIAGNOSIS — D509 Iron deficiency anemia, unspecified: Secondary | ICD-10-CM | POA: Diagnosis not present

## 2019-01-11 DIAGNOSIS — K921 Melena: Secondary | ICD-10-CM

## 2019-01-11 DIAGNOSIS — K51311 Ulcerative (chronic) rectosigmoiditis with rectal bleeding: Secondary | ICD-10-CM

## 2019-01-11 MED ORDER — BUDESONIDE 3 MG PO CPEP: 9.0000 mg | ORAL_CAPSULE | Freq: Every day | ORAL | 1 refills | Status: DC

## 2019-01-11 MED ORDER — MESALAMINE 1.2 G PO TBEC: 4.8000 g | DELAYED_RELEASE_TABLET | Freq: Every day | ORAL | 3 refills | Status: DC

## 2019-01-11 NOTE — Progress Notes (Signed)
Referring Provider: Lesleigh Noe, MD Primary Care Physician:  Lesleigh Noe, MD  Chief complaint: Bloating, blood in the stool   IMPRESSION:  Distal ulcerative colitis presenting with blood and mucous in the stool    - initially diagnosed in 1995    - Previously followed by Dr. Amedeo Plenty and Dr. Olevia Perches    - Treated with steroids and mesalamine: No prior biologic therapy    - Previously required hospitalization and blood transfusion    - colonoscopy 08/04/18: mild-moderate colitis from the anal verge to 30 cm    - Labs 06/2018: fecal calprotectin 1578, ESR 29, CRP <1    - Labs 12/2018: Fecal calprotectin 1111  Gastric and duodenal erosions likely due to NSAIDs Iron deficiency anemia    - labs 07/11/18: iron 32, ferritin 5.1, hemoglobin 11.7, MCV 82, RDW 14.9 History of colon polyps (per patient report) Posterior anal fissure Vitamin D deficiency  Persistent distal ulcerative colitis despite full dose Lialda.  Minimal improvement in fecal calprotectin despite over 4 months of treatment.  She remains resistant to Rowasa enemas.  Discussed different treatment options and ultimately agreed to try budesonide 9 mg daily.  Will continue to use serial fecal calprotectin to monitor her response to therapy.  We will plan close follow-up to ensure adequate response to therapy.  Recent labs show persistent iron deficiency anemia.  I have encouraged her to take her oral iron every other day.  We will also start IV iron given her fatigue.  Early satiety and bloating is responding to PPI therapy and is likely due to NSAID-related gastric and duodenal erosions.  She is no longer using any NSAIDs.  PLAN: Trial of Lialda 4.8 g daily, with goal of tapering to 2.4 g daily as symptoms improve Rowasa enemas QHS as needed Budesonide 9 mg daily Avoid all NSAIDs Feraheme X 1 and again on day 8 Continue iron supplements on an every other day schedule Daily calcium 1200 daily and Vit D 800 IU  daily Continue pantoprazole 40 mg QAM Return in 4-6 weeks, repeat fecal calprotectin, ESR, and CRP 2 weeks before her office visit   HPI: Ebony [name] is a 48 y.o. female distal ulcerative colitis. She returns in scheduled follow-up.  The interval history is obtained through the patient and review of her electronic health record.  She was previously seen by Dr. Olevia Perches in 2016. Follows a vegetarian diet.   EGD and Colonoscopy performed 08/04/18 showed a normal esophagus, multiple gastric erosions, and duodenal bulb erosions. Biopsies confirmed peptic duodenitis, mild chronic gastritis without H pylori, and normal esophageal biopsies.  Colonoscopy revealed a posterior anal fissure, distal colon colitis from the anal verge to 30 cm.  Biopsies confirmed active colitis without granulomas. The remainder of the colon and distal terminal ileum was normal endoscopically and histologically. The distal terminal ileum appeared normal.   She did not tolerate a trial of enemas.  Started Lialda 4.8 g daily in August and reports 100% adherence since that time.  Returns today in scheduled follow-up.  Having 3-4 early morning BM within the first couple hours of the day. Having 6 times the rest of the day.  Previously having even more than this.  Symptoms very dependent on when and what she eats as there is a consistent postprandial component. Stool is more formed. Intermittent blood in the stool. Frequent mucous. Continues to have urgency. Continues to have frequent accidents requiring that she wear a pad or keep toilet paper with her.  Appetite is the same - "okay."  Eating. Weight stable if not higher.   She is most bothered by ongoing fatigue.  Bloating largely improved on PPI.  Not using any NSAIDs.  Stiff. Feels like diffusely her joints are worse. Worse in the morning.   She has had both the flu vaccine and the Pneumovax this year.   Using oral iron supplements sporadically.   She does not smoke.  Drinks alcohol socially.   Prior imaging studies include: Abdominal ultrasound June 2001: Normal Barium esophagram in September 2001: small sliding hiatal hernia, otherwise unremarkable  Recent labs: 07/11/2018: Iron 32, ferritin 5.1, CRP less than 1.0, sedimentation rate 29. hemoglobin 11.7, MCV 82, RDW 14.9, platelets 380.  Fecal calprotectin 1578. 11/16/18: normal CMP, ferritin 4.9, WBC 3.7, hgb 12.3, MCV 85.4, RDW 15.3, platelets 321, glucose 87, HgbA1C 5.7. TSH 2.08.  12/29/18: Fecal calprotectin 1,111.    Past Medical History:  Diagnosis Date  . Anemia   . Arthritis   . Blood transfusion without reported diagnosis unknown   for tx uc  . Colon polyps 1997 or 1998?  Marland Kitchen GI bleeding   . Ulcerative colitis [facility])     Past Surgical History:  Procedure Laterality Date  . BASAL CELL CARCINOMA EXCISION  2020   left breast  . COLONOSCOPY  1997 or 1998?    Current Outpatient Medications  Medication Sig Dispense Refill  . augmented betamethasone dipropionate (DIPROLENE-AF) 0.05 % cream APPLY ON RASH TWICE DAILY AS NEEDED FOR FLARES    . Flaxseed, Linseed, (FLAXSEED OIL PO) Take 1 tablet by mouth as needed.    . mesalamine (LIALDA) 1.2 g EC tablet Take 4 tablets (4.8 g total) by mouth daily with breakfast. 120 tablet 3  . mesalamine (ROWASA) 4 g enema USE 1 ENEMA PER RECTUM TWICE DAILY FOR 2 WEEKS 30 enema 1  . OVER THE COUNTER MEDICATION Take 1 tablet by mouth as needed. Pt taking Tumeric    . pantoprazole (PROTONIX) 40 MG tablet Take 1 tablet by mouth once daily 60 tablet 3  . Probiotic Product (PROBIOTIC DAILY PO) Take 1 tablet by mouth as needed.    . traMADol (ULTRAM) 50 MG tablet Take 1 tablet (50 mg total) by mouth every 6 (six) hours as needed. 30 tablet 0   Current Facility-Administered Medications  Medication Dose Route Frequency Provider Last Rate Last Admin  . 0.9 %  sodium chloride infusion  500 mL Intravenous Once Thornton Park, MD        Allergies as of  01/11/2019  . (No Known Allergies)    Family History  Problem Relation Age of Onset  . Diabetes Father   . Heart disease Father   . Hypertension Father   . Hyperlipidemia Father   . Heart attack Father 89  . Other Father        heart bypass and heart transplant  . Diabetes Mother   . Hypertension Mother   . Dementia Mother        senile  . Heart attack Mother 40  . Diabetes Brother   . Heart disease Brother   . Heart disease Maternal Grandmother   . Other Paternal Grandmother        carotid blockage  . Colon cancer Neg Hx   . Colon polyps Neg Hx   . Esophageal cancer Neg Hx   . Kidney disease Neg Hx   . Gallbladder disease Neg Hx     Social History   Socioeconomic History  .  Marital status: Single    Spouse name: Not on file  . Number of children: 1  . Years of education: High school  . Highest education level: Not on file  Occupational History  . Occupation: Sales  Tobacco Use  . Smoking status: Never Smoker  . Smokeless tobacco: Never Used  Substance and Sexual Activity  . Alcohol use: Yes    Alcohol/week: 0.0 standard drinks    Comment: Rarely-about 2 times a year if that  . Drug use: No  . Sexual activity: Yes    Birth control/protection: Condom  Other Topics Concern  . Not on file  Social History Narrative   11/16/18   From: the area   Living: son tyler and boyfriend Dominica Severin (15 yos)   Work: in Press photographer      Family: Has son Dorothea Ogle (63 yo), boyfriend      Enjoys: cooking for other people, walking/hiking      Exercise: not regular routine currently   Diet: small meals, only once per day      Safety   Seat belts: Yes    Guns: Yes  and secure   Safe in relationships: Yes    Social Determinants of Health   Financial Resource Strain: Low Risk   . Difficulty of Paying Living Expenses: Not hard at all  Food Insecurity:   . Worried About Charity fundraiser in the Last Year: Not on file  . Ran Out of Food in the Last Year: Not on file  Transportation  Needs:   . Lack of Transportation (Medical): Not on file  . Lack of Transportation (Non-Medical): Not on file  Physical Activity:   . Days of Exercise per Week: Not on file  . Minutes of Exercise per Session: Not on file  Stress:   . Feeling of Stress : Not on file  Social Connections:   . Frequency of Communication with Friends and Family: Not on file  . Frequency of Social Gatherings with Friends and Family: Not on file  . Attends Religious Services: Not on file  . Active Member of Clubs or Organizations: Not on file  . Attends Archivist Meetings: Not on file  . Marital Status: Not on file  Intimate Partner Violence:   . Fear of Current or Ex-Partner: Not on file  . Emotionally Abused: Not on file  . Physically Abused: Not on file  . Sexually Abused: Not on file    Review of Systems: Non-pruritic rash on her elbow that has been there for 2 years.  Recently evaluated by a dermatologist in [place].  New joint complaints with stiffness  Physical Exam: Vital signs were reviewed. General:   Alert, well-nourished, pleasant and cooperative in NAD.  No pallor. Head:  Normocephalic and atraumatic.  No alopecia. Eyes:  Sclera clear, no icterus.   Conjunctiva pink. Mouth:  No deformity or lesions.  No atrophic glossitis. No chelitis. Neck:  Supple; no thyromegaly. Lungs:  Clear throughout to auscultation.   No wheezes.  Heart:  Regular rate and rhythm; no murmurs Abdomen:  Soft, nontender, normal bowel sounds. No rebound or guarding. No hepatosplenomegaly Rectal:  Deferred  Msk:  Symmetrical without gross deformities. Extremities:  No gross deformities or edema. Neurologic:  Alert and  oriented x4;  grossly nonfocal Skin:  No rash or bruise. Psych:  Alert and cooperative. Normal mood and affect.  Chasta Deshpande L. Tarri Glenn, MD, MPH [facility] 01/11/2019, 9:00 AM

## 2019-01-11 NOTE — Patient Instructions (Addendum)
Continue Lialda 4.8g daily. Continue to use Rowasa enemas QHS as needed. Let's try Budesonide 9 mg daily to see if this helps.  Continue to avoid all NSAIDs. I am recommending a trial of IV iron. We will call you with an appointment.  Continue iron supplements on an every other day schedule Continue to take calcium 1200 daily and Vit D 800 IU daily Continue pantoprazole 40 mg every morning Let's plan to check in 4-6 weeks from now. Will repeat fecal calprotectin, ESR, ferritin, and CRP 1 week before your visit.

## 2019-01-15 ENCOUNTER — Telehealth: Payer: Self-pay | Admitting: Gastroenterology

## 2019-01-15 ENCOUNTER — Other Ambulatory Visit: Payer: Self-pay | Admitting: Emergency Medicine

## 2019-01-15 DIAGNOSIS — D500 Iron deficiency anemia secondary to blood loss (chronic): Secondary | ICD-10-CM

## 2019-01-15 NOTE — Telephone Encounter (Signed)
Pt called to f/u on appt for iron infusion. She wants to know if you were able to find an appt before the end of the year.

## 2019-01-16 NOTE — Telephone Encounter (Signed)
Spoke with patient yesterday and was able to get her scheduled for 12/31 and 1/7. She states she may be traveling and might have to reschedule. She will call back if necessary.

## 2019-01-18 ENCOUNTER — Ambulatory Visit (HOSPITAL_COMMUNITY)
Admission: RE | Admit: 2019-01-18 | Discharge: 2019-01-18 | Disposition: A | Discharge status: Home / Self Care | Payer: BC Managed Care – PPO | Source: Ambulatory Visit | Attending: Internal Medicine | Admitting: Internal Medicine

## 2019-01-18 ENCOUNTER — Other Ambulatory Visit: Payer: Self-pay

## 2019-01-18 DIAGNOSIS — D500 Iron deficiency anemia secondary to blood loss (chronic): Secondary | ICD-10-CM | POA: Insufficient documentation

## 2019-01-18 MED ORDER — SODIUM CHLORIDE 0.9 % IV SOLN
510.0000 mg | INTRAVENOUS | Status: DC
  Administered 2019-01-18: 510 mg via INTRAVENOUS
  Filled 2019-01-18: qty 17

## 2019-01-18 MED ORDER — SODIUM CHLORIDE 0.9 % IV SOLN
INTRAVENOUS | Status: DC | PRN
  Administered 2019-01-18: 250 mL via INTRAVENOUS

## 2019-01-18 NOTE — Progress Notes (Signed)
                   Patient Care Center Note   Diagnosis: Iron Deficiency Anemia  Provider: Thornton Park, MD   Procedure: IV Feraheme   Note: Received IV Feraheme via PIV. Tolerated well. Observed 30 minutes post transfusion. Vitals stable/. Discharge instructions given. Alert, oriented and ambulatory at discharge.  Otho Bellows, RN

## 2019-01-18 NOTE — Discharge Instructions (Signed)

## 2019-01-25 ENCOUNTER — Ambulatory Visit (HOSPITAL_COMMUNITY)
Admission: RE | Admit: 2019-01-25 | Discharge: 2019-01-25 | DRG: 812 | Disposition: A | Discharge status: Home / Self Care | Payer: BC Managed Care – PPO | Source: Ambulatory Visit | Attending: Internal Medicine | Admitting: Internal Medicine

## 2019-01-25 ENCOUNTER — Other Ambulatory Visit: Payer: Self-pay

## 2019-01-25 DIAGNOSIS — D509 Iron deficiency anemia, unspecified: Secondary | ICD-10-CM | POA: Diagnosis present

## 2019-01-25 MED ORDER — SODIUM CHLORIDE 0.9 % IV SOLN
510.0000 mg | Freq: Once | INTRAVENOUS | Status: AC
  Administered 2019-01-25: 510 mg via INTRAVENOUS
  Filled 2019-01-25: qty 17

## 2019-01-25 MED ORDER — SODIUM CHLORIDE 0.9 % IV SOLN
INTRAVENOUS | Status: DC | PRN
  Administered 2019-01-25: 250 mL via INTRAVENOUS

## 2019-01-25 NOTE — Discharge Instructions (Signed)

## 2019-01-25 NOTE — Progress Notes (Signed)
Patient Care Center Note   Diagnosis: Iron Deficiency Anemia   Provider: Thornton Park, MD   Procedure: IV Feraheme   Note: Received IV Feraheme via PIV. Tolerated well. Observed 30 minutes post transfusion. Vitals stable/. Discharge instructions given. Alert, oriented and ambulatory at discharge.

## 2019-02-23 ENCOUNTER — Encounter: Payer: Self-pay | Admitting: Obstetrics and Gynecology

## 2019-04-14 ENCOUNTER — Other Ambulatory Visit: Payer: Self-pay | Admitting: Gastroenterology

## 2019-06-28 ENCOUNTER — Other Ambulatory Visit: Payer: Self-pay

## 2019-07-11 ENCOUNTER — Other Ambulatory Visit (INDEPENDENT_AMBULATORY_CARE_PROVIDER_SITE_OTHER): Payer: BC Managed Care – PPO

## 2019-07-11 DIAGNOSIS — K51311 Ulcerative (chronic) rectosigmoiditis with rectal bleeding: Secondary | ICD-10-CM | POA: Diagnosis not present

## 2019-07-11 DIAGNOSIS — R195 Other fecal abnormalities: Secondary | ICD-10-CM

## 2019-07-11 DIAGNOSIS — K921 Melena: Secondary | ICD-10-CM

## 2019-07-11 DIAGNOSIS — D509 Iron deficiency anemia, unspecified: Secondary | ICD-10-CM

## 2019-07-11 LAB — C-REACTIVE PROTEIN: CRP: <1 mg/dL (ref 0.5–20.0)

## 2019-07-11 LAB — SEDIMENTATION RATE: Sed Rate: 4 mm/hr (ref 0–20)

## 2019-07-11 LAB — FERRITIN: Ferritin: 34.4 ng/mL (ref 10.0–291.0)

## 2019-07-30 ENCOUNTER — Other Ambulatory Visit: Payer: BC Managed Care – PPO

## 2019-07-30 DIAGNOSIS — D509 Iron deficiency anemia, unspecified: Secondary | ICD-10-CM

## 2019-07-30 DIAGNOSIS — K921 Melena: Secondary | ICD-10-CM | POA: Diagnosis not present

## 2019-07-30 DIAGNOSIS — R195 Other fecal abnormalities: Secondary | ICD-10-CM | POA: Diagnosis not present

## 2019-07-30 DIAGNOSIS — K51311 Ulcerative (chronic) rectosigmoiditis with rectal bleeding: Secondary | ICD-10-CM

## 2019-08-02 ENCOUNTER — Other Ambulatory Visit: Payer: Self-pay | Admitting: *Deleted

## 2019-08-02 ENCOUNTER — Other Ambulatory Visit: Payer: BC Managed Care – PPO

## 2019-08-04 ENCOUNTER — Other Ambulatory Visit: Payer: Self-pay | Admitting: Gastroenterology

## 2019-08-06 LAB — CALPROTECTIN, FECAL: Calprotectin, Fecal: 1234 ug/g — ABNORMAL HIGH (ref 0–120)

## 2019-08-08 ENCOUNTER — Ambulatory Visit (INDEPENDENT_AMBULATORY_CARE_PROVIDER_SITE_OTHER): Payer: BC Managed Care – PPO | Admitting: Gastroenterology

## 2019-08-08 ENCOUNTER — Other Ambulatory Visit (INDEPENDENT_AMBULATORY_CARE_PROVIDER_SITE_OTHER): Payer: BC Managed Care – PPO

## 2019-08-08 ENCOUNTER — Encounter: Payer: Self-pay | Admitting: Gastroenterology

## 2019-08-08 ENCOUNTER — Telehealth: Payer: Self-pay

## 2019-08-08 VITALS — BP 120/80 | HR 83 | Ht 60.0 in | Wt 150.0 lb

## 2019-08-08 DIAGNOSIS — K5190 Ulcerative colitis, unspecified, without complications: Secondary | ICD-10-CM | POA: Diagnosis not present

## 2019-08-08 DIAGNOSIS — K51311 Ulcerative (chronic) rectosigmoiditis with rectal bleeding: Secondary | ICD-10-CM

## 2019-08-08 DIAGNOSIS — D500 Iron deficiency anemia secondary to blood loss (chronic): Secondary | ICD-10-CM | POA: Diagnosis not present

## 2019-08-08 DIAGNOSIS — K921 Melena: Secondary | ICD-10-CM | POA: Diagnosis not present

## 2019-08-08 LAB — IBC + FERRITIN
Ferritin: 25.6 ng/mL (ref 10.0–291.0)
Iron: 112 ug/dL (ref 42–145)
Saturation Ratios: 36.9 % (ref 20.0–50.0)
Transferrin: 217 mg/dL (ref 212.0–360.0)

## 2019-08-08 LAB — C-REACTIVE PROTEIN: CRP: <1 mg/dL (ref 0.5–20.0)

## 2019-08-08 LAB — SEDIMENTATION RATE: Sed Rate: 7 mm/hr (ref 0–20)

## 2019-08-08 LAB — HEMOGLOBIN: Hemoglobin: 16 g/dL — ABNORMAL HIGH (ref 12.0–15.0)

## 2019-08-08 MED ORDER — MESALAMINE 4 G RE ENEM: 4.0000 g | ENEMA | Freq: Two times a day (BID) | RECTAL | 1 refills | Status: DC

## 2019-08-08 MED ORDER — MESALAMINE 1.2 G PO TBEC: 4.8000 g | DELAYED_RELEASE_TABLET | Freq: Every day | ORAL | 3 refills | Status: DC

## 2019-08-08 MED ORDER — MESALAMINE 4 G RE ENEM: 4.0000 g | ENEMA | Freq: Two times a day (BID) | RECTAL | 0 refills | Status: DC

## 2019-08-08 NOTE — Patient Instructions (Addendum)
Continue Lialda 4.8 grams daily and budesonide in addition to the Rowasa enemas twice daily.   I recommend that you review the Crohns and Colitis Foundation website for information about biologic therapy for ulceratve colitis. We will want to consider these treatments if you do not respond to the enemas.  Unlike other medications, biologics are protein-based therapies that are created out of material naturally found in life. These medications are antibodies that stop certain proteins in the body from causing inflammation.  I have recommended labs today to follow-up on your iron deficiency and for safety screening in the event that we need to use a biologic medication in the future.   I recommend the Covid vaccine.    Your provider has requested that you go to the basement level for lab work before leaving today. Press "B" on the elevator. The lab is located at the first door on the left as you exit the elevator.  We have sent the following medications to your pharmacy for you to pick up at your convenience: Rowasa enemas and Lialda   Continue Pantoprazole 31m - daily.   Follow-up in 6 weeks: 10/08/19 Have labs done 2 weeks(09/24/19) prior to next appointment-Fecal calprotectin, ESR, and CRP.    [facility].  Dr.Lamae Fosco

## 2019-08-08 NOTE — Telephone Encounter (Signed)
Ebony [name] was corrected on Rx for Rowasa Enemas-verbal given over the phone to pharmacist.

## 2019-08-08 NOTE — Progress Notes (Signed)
Referring Provider: Lesleigh Noe, MD Primary Care Physician:  Lesleigh Noe, MD  Chief complaint: Bloating, blood in the stool   IMPRESSION:  Distal ulcerative colitis presenting with blood and mucous in the stool    - initially diagnosed in 1995    - Previously followed by Dr. Amedeo Plenty and Dr. Olevia Perches    - Treated with steroids and mesalamine: No prior biologic therapy    - Previously required hospitalization and blood transfusion    - colonoscopy 08/04/18: mild-moderate colitis from the anal verge to 30 cm    - Labs 06/2018: fecal calprotectin 1578, ESR 29, CRP <1    - Labs 12/2018: Fecal calprotectin 1111  Gastric and duodenal erosions likely due to NSAIDs Iron deficiency anemia    - labs 07/11/18: iron 32, ferritin 5.1, hemoglobin 11.7, MCV 82, RDW 14.9 History of colon polyps (per patient report) Posterior anal fissure Vitamin D deficiency  Distal ulcerative colitis:  Fecal calprotectin remains elevated despite of treatment despite Lialda and budesonide.  She remains resistant to Rowasa enemas or steroids, but ultimately agreed to Rowasa hoping to avoid steroids and biologics.  Will continue to use serial fecal calprotectin to monitor her response to therapy.  We will plan close follow-up to ensure adequate response to therapy including colonoscopy. Screening labs today to determine if she is a candidate for biologic therapy in the future.   Iron deficiency anemia: Labs today to monitor response to IV therapy.    PLAN: - Continue Lialda 4.8 g daily - Rowasa 4g enemas BID x 6 weeks, then reduce to QD - Continue budesonide 9 mg daily - Avoid all NSAIDs - Labs: Iron, ferritin, hemoglobin, Hepatitis B surface antigen (HBsAg), hepatitis B surface antibody, and hepatitis B core antibody HCV Ab, QuantiFERON-TB Gold, HIV screening, Varicella zoster virus IgG, Anti-EBV and anti-CMV antibodies  - Continue iron supplements on an every other day schedule - Daily calcium 1200 daily and  Vit D 800 IU daily - Continue pantoprazole 40 mg QAM - Return in 4-6 weeks, repeat fecal calprotectin, ESR, and CRP 2 weeks before her office visit   HPI: Ebony [name] is a 49 y.o. female with distal ulcerative colitis. She returns in scheduled follow-up having been last seen 01/11/19.  The interval history is obtained through the patient and review of her electronic health record. Follows a vegetarian diet.   Today she brings records from 1997 showing an appointment with Dr. Amedeo Plenty for ulcerative colitis and iron deficiency anemia - adding chronicity to her diagnosis.   EGD and Colonoscopy performed 08/04/18 showed a normal esophagus, multiple gastric erosions, and duodenal bulb erosions. Biopsies confirmed peptic duodenitis, mild chronic gastritis without H pylori, and normal esophageal biopsies.  Colonoscopy revealed a posterior anal fissure, distal colon colitis from the anal verge to 30 cm.  Biopsies confirmed active colitis without granulomas. The remainder of the colon and distal terminal ileum was normal endoscopically and histologically. The distal terminal ileum appeared normal.   She did not tolerate a trial of enemas.  Started Lialda 4.8 g daily in August 2020.  Had iron infusion x 2. No significant change in symptoms.    Taking mesalamine 4 QAM and budesonide 9 mg daily. Does not want to use Rowasa or any locally administered mesalamine or steroid.  Marland Kitchen  Also taking a probiotic, Vitamin D, and she started to take biotin for hair loss.   Continues to have 3 BM daily. Some intermittent blood or mucous. Feels like the  frequency may have overall improved.  Previously having up to 6 bowel movements daily with significant ugency.   Appetite is the same - "okay."  Eating. Weight stable if not higher.   She is most bothered by ongoing fatigue.  Bloating largely improved on PPI.  Not using any NSAIDs.  Stiff. Feels like diffusely her joints are worse. Worse in the morning.   She  has had both the flu vaccine and the Pneumovax this year.   Using oral iron supplements sporadically.   She does not smoke. Drinks alcohol socially.     Prior imaging studies include: Abdominal ultrasound June 2001: Normal Barium esophagram in September 2001: small sliding hiatal hernia, otherwise unremarkable  Recent labs: 07/11/2018: Iron 32, ferritin 5.1, CRP less than 1.0, sedimentation rate 29. hemoglobin 11.7, MCV 82, RDW 14.9, platelets 380.  Fecal calprotectin 1578. 11/16/18: normal CMP, ferritin 4.9, WBC 3.7, hgb 12.3, MCV 85.4, RDW 15.3, platelets 321, glucose 87, HgbA1C 5.7. TSH 2.08.  12/29/18: Fecal calprotectin 1,111.  07/11/19: ferritin 34, CRP <1, ESR 4 07/30/19: Fecal calprotectin 1,234.   Past Medical History:  Diagnosis Date  . Anemia   . Arthritis   . Blood transfusion without reported diagnosis unknown   for tx uc  . Colon polyps 1997 or 1998?  Marland Kitchen GI bleeding   . Ulcerative colitis [facility])     Past Surgical History:  Procedure Laterality Date  . BASAL CELL CARCINOMA EXCISION  2020   left breast  . COLONOSCOPY  1997 or 1998?    Current Outpatient Medications  Medication Sig Dispense Refill  . augmented betamethasone dipropionate (DIPROLENE-AF) 0.05 % cream APPLY ON RASH TWICE DAILY AS NEEDED FOR FLARES    . BIOTIN PO Take 1 tablet by mouth daily.    . budesonide (ENTOCORT EC) 3 MG 24 hr capsule TAKE 3 CAPSULES BY MOUTH ONCE DAILY 90 capsule 3  . cholecalciferol (VITAMIN D3) 25 MCG (1000 UNIT) tablet Take 1,000 Units by mouth daily.    . Flaxseed, Linseed, (FLAXSEED OIL PO) Take 1 tablet by mouth as needed.    . mesalamine (LIALDA) 1.2 g EC tablet Take 4 tablets (4.8 g total) by mouth daily with breakfast. 120 tablet 3  . OVER THE COUNTER MEDICATION Take 1 tablet by mouth as needed. Pt taking Tumeric    . pantoprazole (PROTONIX) 40 MG tablet Take 1 tablet by mouth once daily 60 tablet 3  . Probiotic Product (PROBIOTIC DAILY PO) Take 1 tablet by mouth as  needed.    . traMADol (ULTRAM) 50 MG tablet Take 1 tablet (50 mg total) by mouth every 6 (six) hours as needed. 30 tablet 0   Current Facility-Administered Medications  Medication Dose Route Frequency Provider Last Rate Last Admin  . 0.9 %  sodium chloride infusion  500 mL Intravenous Once [place], MD        Allergies as of 08/08/2019  . (No Known Allergies)    Family History  Problem Relation Age of Onset  . Diabetes Father   . Heart disease Father   . Hypertension Father   . Hyperlipidemia Father   . Heart attack Father 71  . Other Father        heart bypass and heart transplant  . Diabetes Mother   . Hypertension Mother   . Dementia Mother        senile  . Heart attack Mother 15  . Diabetes Brother   . Heart disease Brother   . Heart disease  Maternal Grandmother   . Other Paternal Grandmother        carotid blockage  . Colon cancer Neg Hx   . Colon polyps Neg Hx   . Esophageal cancer Neg Hx   . Kidney disease Neg Hx   . Gallbladder disease Neg Hx     Social History   Socioeconomic History  . Marital status: Single    Spouse name: Not on file  . Number of children: 1  . Years of education: High school  . Highest education level: Not on file  Occupational History  . Occupation: Sales  Tobacco Use  . Smoking status: Never Smoker  . Smokeless tobacco: Never Used  Vaping Use  . Vaping Use: Never used  Substance and Sexual Activity  . Alcohol use: Yes    Alcohol/week: 0.0 standard drinks    Comment: Rarely-about 2 times a year if that  . Drug use: No  . Sexual activity: Yes    Birth control/protection: Condom  Other Topics Concern  . Not on file  Social History Narrative   11/16/18   From: the area   Living: son tyler and boyfriend Dominica Severin (15 yos)   Work: in Press photographer      Family: Has son Dorothea Ogle (37 yo), boyfriend      Enjoys: cooking for other people, walking/hiking      Exercise: not regular routine currently   Diet: small meals, only once  per day      Safety   Seat belts: Yes    Guns: Yes  and secure   Safe in relationships: Yes    Social Determinants of Health   Financial Resource Strain: Low Risk   . Difficulty of Paying Living Expenses: Not hard at all  Food Insecurity:   . Worried About Charity fundraiser in the Last Year:   . Arboriculturist in the Last Year:   Transportation Needs:   . Film/video editor (Medical):   Marland Kitchen Lack of Transportation (Non-Medical):   Physical Activity:   . Days of Exercise per Week:   . Minutes of Exercise per Session:   Stress:   . Feeling of Stress :   Social Connections:   . Frequency of Communication with Friends and Family:   . Frequency of Social Gatherings with Friends and Family:   . Attends Religious Services:   . Active Member of Clubs or Organizations:   . Attends Archivist Meetings:   Marland Kitchen Marital Status:   Intimate Partner Violence:   . Fear of Current or Ex-Partner:   . Emotionally Abused:   Marland Kitchen Physically Abused:   . Sexually Abused:     Review of Systems: Non-pruritic rash on her elbow that has been there for 2 years. Persists and is overall unchanged.    Physical Exam: Vital signs were reviewed. General:   Alert, well-nourished, pleasant and cooperative in NAD.  No pallor. Head:  Normocephalic and atraumatic.  No alopecia. Eyes:  Sclera clear, no icterus.   Conjunctiva pink. Mouth:  No deformity or lesions.  No atrophic glossitis. No chelitis. Neck:  Supple; no thyromegaly. Lungs:  Clear throughout to auscultation.   No wheezes.  Heart:  Regular rate and rhythm; no murmurs Abdomen:  Soft, nontender, normal bowel sounds. No rebound or guarding. No hepatosplenomegaly Rectal:  Deferred  Msk:  Symmetrical without gross deformities. Extremities:  No gross deformities or edema. Neurologic:  Alert and  oriented x4;  grossly nonfocal Skin:  No rash  or bruise. Psych:  Alert and cooperative. Normal mood and affect.  Aleesha Ringstad L. Tarri Glenn, MD,  [facility] 08/08/2019, 10:43 AM

## 2019-08-09 LAB — HCV AB W/RFLX TO VERIFICATION: HCV Ab: <0.1 s/co ratio (ref 0.0–0.9)

## 2019-08-09 LAB — HCV INTERPRETATION

## 2019-08-11 LAB — QUANTIFERON-TB GOLD PLUS
Mitogen-NIL: >10 IU/mL
NIL: 0.04 IU/mL
QuantiFERON-TB Gold Plus: NEGATIVE
TB1-NIL: 0.01 IU/mL
TB2-NIL: 0.01 IU/mL

## 2019-08-11 LAB — EPSTEIN-BARR VIRUS VCA, IGM: EBV VCA IgM: <36 U/mL

## 2019-08-11 LAB — EPSTEIN-BARR VIRUS VCA, IGG: EBV VCA IgG: >750 U/mL — ABNORMAL HIGH

## 2019-08-11 LAB — HEPATITIS B CORE ANTIBODY, TOTAL: Hep B Core Total Ab: NONREACTIVE

## 2019-08-11 LAB — HIV ANTIBODY (ROUTINE TESTING W REFLEX): HIV 1&2 Ab, 4th Generation: NONREACTIVE

## 2019-08-11 LAB — HEPATITIS B SURFACE ANTIGEN: Hepatitis B Surface Ag: NONREACTIVE

## 2019-08-11 LAB — CMV IGM: CMV IgM: <30 AU/mL

## 2019-08-12 ENCOUNTER — Encounter: Payer: Self-pay | Admitting: Gastroenterology

## 2019-08-28 ENCOUNTER — Ambulatory Visit: Payer: BC Managed Care – PPO

## 2019-09-20 ENCOUNTER — Other Ambulatory Visit: Payer: BC Managed Care – PPO

## 2019-09-20 DIAGNOSIS — K921 Melena: Secondary | ICD-10-CM

## 2019-09-20 DIAGNOSIS — K51311 Ulcerative (chronic) rectosigmoiditis with rectal bleeding: Secondary | ICD-10-CM

## 2019-09-20 DIAGNOSIS — D500 Iron deficiency anemia secondary to blood loss (chronic): Secondary | ICD-10-CM | POA: Diagnosis not present

## 2019-09-26 ENCOUNTER — Telehealth: Payer: Self-pay | Admitting: Family Medicine

## 2019-09-26 NOTE — Telephone Encounter (Signed)
Please let patient know that if she wants to discuss all this at a physical it may result in two copays.

## 2019-09-26 NOTE — Telephone Encounter (Signed)
I spoke with pt; pt notified as instructed and voiced understanding.pt said her goal is at [facility] on 11/01/21to discuss heart disease hx of pts family. Pt said she wants to discuss the hereditary possibilities and some testing to see where pt is at with any possible heart issues.Pt is not having any CP now and UC & ED precautions given and pt voiced understanding. FYI to Dr Einar Pheasant.

## 2019-09-26 NOTE — Telephone Encounter (Signed)
Pt has cpx appointment 11/1 appointment and wants to discuss family history of heart diease she stated she is having a little pains on left Happened a couple days ago and she also wants to discuss ongoing sinus issues.  Not have any sinus issues.  I made appointment 3mn   Is it to discuss all this at cpx appointment  Sent pt to triage for pain in chest

## 2019-09-26 NOTE — Telephone Encounter (Signed)
[facility] - Client TELEPHONE ADVICE RECORD [facility] Patient Name: Ebony [name] Gender: Female DOB: 1970-04-09 Age: 49 Y 10 M 12 D Return Phone Number: [phone] (Primary) Address: City/State/Zip: [name] [address] Client [place] [facility] - Client Client Site [facility] - Day Physician Waunita Schooner- MD Contact Type Call Who Is Calling Patient / Member / Family / Caregiver Call Type Triage / Clinical Relationship To Patient Self Return Phone Number [phone] (Primary) Chief Complaint Health information question (non symptomatic) Reason for Call Symptomatic / Request for [place] states she was experiencing left side chest pains and has family history of heart disease. Caller is not experiencing the pain at the moment, but would like further advice. Translation No Disp. Time [name] Time) Disposition Final User 09/26/2019 11:56:53 AM Attempt made - line busy Ysidro Evert, RN, Levada Dy 09/26/2019 12:10:41 PM Attempt made - line busy Ysidro Evert, RN, Angela 09/26/2019 12:35:25 PM FINAL ATTEMPT MADE - no message left Earleen Reaper 09/26/2019 12:35:49 PM Send to RN Final Attempt Glenice Bow, RN, Levada Dy 09/26/2019 1:33:25 PM FINAL ATTEMPT MADE - no message left Yes Rock Nephew, RN, Juliann Pulse

## 2019-09-27 LAB — CALPROTECTIN, FECAL: Calprotectin, Fecal: <16 ug/g (ref 0–120)

## 2019-09-27 NOTE — Telephone Encounter (Signed)
Noted  

## 2019-09-27 NOTE — Telephone Encounter (Signed)
Attempted to call patient to notify of previous message from PCP.

## 2019-09-27 NOTE — Telephone Encounter (Signed)
Patient called in and notified of PCP's message. Patient schedule for 2nd visit to discuss heart disease history.

## 2019-10-08 ENCOUNTER — Other Ambulatory Visit: Payer: BC Managed Care – PPO

## 2019-10-08 ENCOUNTER — Ambulatory Visit (INDEPENDENT_AMBULATORY_CARE_PROVIDER_SITE_OTHER): Payer: BC Managed Care – PPO | Admitting: Gastroenterology

## 2019-10-08 ENCOUNTER — Ambulatory Visit (INDEPENDENT_AMBULATORY_CARE_PROVIDER_SITE_OTHER): Payer: BC Managed Care – PPO | Admitting: Family Medicine

## 2019-10-08 ENCOUNTER — Encounter: Payer: Self-pay | Admitting: Family Medicine

## 2019-10-08 ENCOUNTER — Other Ambulatory Visit: Payer: Self-pay

## 2019-10-08 ENCOUNTER — Other Ambulatory Visit (INDEPENDENT_AMBULATORY_CARE_PROVIDER_SITE_OTHER): Payer: BC Managed Care – PPO

## 2019-10-08 ENCOUNTER — Encounter: Payer: Self-pay | Admitting: Gastroenterology

## 2019-10-08 VITALS — BP 124/80 | HR 77 | Temp 98.0°F | Ht 60.0 in | Wt 153.5 lb

## 2019-10-08 VITALS — BP 130/90 | HR 72 | Ht 60.0 in | Wt 152.5 lb

## 2019-10-08 DIAGNOSIS — D500 Iron deficiency anemia secondary to blood loss (chronic): Secondary | ICD-10-CM | POA: Diagnosis not present

## 2019-10-08 DIAGNOSIS — E782 Mixed hyperlipidemia: Secondary | ICD-10-CM

## 2019-10-08 DIAGNOSIS — K51311 Ulcerative (chronic) rectosigmoiditis with rectal bleeding: Secondary | ICD-10-CM | POA: Diagnosis not present

## 2019-10-08 DIAGNOSIS — Z8249 Family history of ischemic heart disease and other diseases of the circulatory system: Secondary | ICD-10-CM

## 2019-10-08 DIAGNOSIS — M2560 Stiffness of unspecified joint, not elsewhere classified: Secondary | ICD-10-CM

## 2019-10-08 DIAGNOSIS — Z23 Encounter for immunization: Secondary | ICD-10-CM

## 2019-10-08 DIAGNOSIS — R002 Palpitations: Secondary | ICD-10-CM

## 2019-10-08 LAB — CBC
HCT: 41.5 % (ref 36.0–46.0)
Hemoglobin: 14 g/dL (ref 12.0–15.0)
MCHC: 33.7 g/dL (ref 30.0–36.0)
MCV: 94.4 fl (ref 78.0–100.0)
Platelets: 270 10*3/uL (ref 150.0–400.0)
RBC: 4.39 Mil/uL (ref 3.87–5.11)
RDW: 13.2 % (ref 11.5–15.5)
WBC: 6.2 10*3/uL (ref 4.0–10.5)

## 2019-10-08 LAB — LIPID PANEL
Cholesterol: 190 mg/dL (ref 0–200)
HDL: 66.1 mg/dL (ref >39.00)
LDL Cholesterol: 101 mg/dL — ABNORMAL HIGH (ref 0–99)
NonHDL: 124.31
Total CHOL/HDL Ratio: 3
Triglycerides: 119 mg/dL (ref 0.0–149.0)
VLDL: 23.8 mg/dL (ref 0.0–40.0)

## 2019-10-08 LAB — COMPREHENSIVE METABOLIC PANEL
ALT: 12 U/L (ref 0–35)
AST: 16 U/L (ref 0–37)
Albumin: 4.3 g/dL (ref 3.5–5.2)
Alkaline Phosphatase: 44 U/L (ref 39–117)
BUN: 4 mg/dL — ABNORMAL LOW (ref 6–23)
CO2: 28 mEq/L (ref 19–32)
Calcium: 9.3 mg/dL (ref 8.4–10.5)
Chloride: 104 mEq/L (ref 96–112)
Creatinine, Ser: 0.52 mg/dL (ref 0.40–1.20)
GFR: 126.45 mL/min (ref >60.00)
Glucose, Bld: 87 mg/dL (ref 70–99)
Potassium: 3.7 mEq/L (ref 3.5–5.1)
Sodium: 138 mEq/L (ref 135–145)
Total Bilirubin: 0.5 mg/dL (ref 0.2–1.2)
Total Protein: 6.8 g/dL (ref 6.0–8.3)

## 2019-10-08 NOTE — Patient Instructions (Addendum)
Below is information about the imaging test we discussed. As I mentioned this I not covered by insurance but the cost is typically less than $150  Labs: Lipoprotein A to see risk for vascular disease Routine labs: liver, kidney, diabetes screen, blood counts   Can always consider cardiology to see if they have any additional tests they would recommend.   EKG was normal   DASH Eating Plan DASH stands for "Dietary Approaches to Stop Hypertension." The DASH eating plan is a healthy eating plan that has been shown to reduce high blood pressure (hypertension). It may also reduce your risk for type 2 diabetes, heart disease, and stroke. The DASH eating plan may also help with weight loss. What are tips for following this plan?  General guidelines  Avoid eating more than 2,300 mg (milligrams) of salt (sodium) a day. If you have hypertension, you may need to reduce your sodium intake to 1,500 mg a day.  Limit alcohol intake to no more than 1 drink a day for nonpregnant women and 2 drinks a day for men. One drink equals 12 oz of beer, 5 oz of wine, or 1 oz of hard liquor.  Work with your health care provider to maintain a healthy body weight or to lose weight. Ask what an ideal weight is for you.  Get at least 30 minutes of exercise that causes your heart to beat faster (aerobic exercise) most days of the week. Activities may include walking, swimming, or biking.  Work with your health care provider or diet and nutrition specialist (dietitian) to adjust your eating plan to your individual calorie needs. Reading food labels   Check food labels for the amount of sodium per serving. Choose foods with less than 5 percent of the Daily Value of sodium. Generally, foods with less than 300 mg of sodium per serving fit into this eating plan.  To find whole grains, look for the word "whole" as the first word in the ingredient list. Shopping  Buy products labeled as "low-sodium" or "no salt  added."  Buy fresh foods. Avoid canned foods and premade or frozen meals. Cooking  Avoid adding salt when cooking. Use salt-free seasonings or herbs instead of table salt or sea salt. Check with your health care provider or pharmacist before using salt substitutes.  Do not fry foods. Cook foods using healthy methods such as baking, boiling, grilling, and broiling instead.  Cook with heart-healthy oils, such as olive, canola, soybean, or sunflower oil. Meal planning  Eat a balanced diet that includes: ? 5 or more servings of fruits and vegetables each day. At each meal, try to fill half of your plate with fruits and vegetables. ? Up to 6-8 servings of whole grains each day. ? Less than 6 oz of lean meat, poultry, or fish each day. A 3-oz serving of meat is about the same size as a deck of cards. One egg equals 1 oz. ? 2 servings of low-fat dairy each day. ? A serving of nuts, seeds, or beans 5 times each week. ? Heart-healthy fats. Healthy fats called Omega-3 fatty acids are found in foods such as flaxseeds and coldwater fish, like sardines, salmon, and mackerel.  Limit how much you eat of the following: ? Canned or prepackaged foods. ? Food that is high in trans fat, such as fried foods. ? Food that is high in saturated fat, such as fatty meat. ? Sweets, desserts, sugary drinks, and other foods with added sugar. ? Full-fat dairy products.  Do not salt foods before eating.  Try to eat at least 2 vegetarian meals each week.  Eat more home-cooked food and less restaurant, buffet, and fast food.  When eating at a restaurant, ask that your food be prepared with less salt or no salt, if possible. What foods are recommended? The items listed may not be a complete list. Talk with your dietitian about what dietary choices are best for you. Grains Whole-grain or whole-wheat bread. Whole-grain or whole-wheat pasta. Brown rice. [place]ena Morrow. Bulgur. Whole-grain and low-sodium cereals.  Pita bread. Low-fat, low-sodium crackers. Whole-wheat flour tortillas. Vegetables Fresh or frozen vegetables (raw, steamed, roasted, or grilled). Low-sodium or reduced-sodium tomato and vegetable juice. Low-sodium or reduced-sodium tomato sauce and tomato paste. Low-sodium or reduced-sodium canned vegetables. Fruits All fresh, dried, or frozen fruit. Canned fruit in natural juice (without added sugar). Meat and other protein foods Skinless chicken or [place]. Ground chicken or [place]. Pork with fat trimmed off. Fish and seafood. Egg whites. Dried beans, peas, or lentils. Unsalted nuts, nut butters, and seeds. Unsalted canned beans. Lean cuts of beef with fat trimmed off. Low-sodium, lean deli meat. Dairy Low-fat (1%) or fat-free (skim) milk. Fat-free, low-fat, or reduced-fat cheeses. Nonfat, low-sodium ricotta or cottage cheese. Low-fat or nonfat yogurt. Low-fat, low-sodium cheese. Fats and oils Soft margarine without trans fats. Vegetable oil. Low-fat, reduced-fat, or light mayonnaise and salad dressings (reduced-sodium). Canola, safflower, olive, soybean, and sunflower oils. Avocado. Seasoning and other foods Herbs. Spices. Seasoning mixes without salt. Unsalted popcorn and pretzels. Fat-free sweets. What foods are not recommended? The items listed may not be a complete list. Talk with your dietitian about what dietary choices are best for you. Grains Baked goods made with fat, such as croissants, muffins, or some breads. Dry pasta or rice meal packs. Vegetables Creamed or fried vegetables. Vegetables in a cheese sauce. Regular canned vegetables (not low-sodium or reduced-sodium). Regular canned tomato sauce and paste (not low-sodium or reduced-sodium). Regular tomato and vegetable juice (not low-sodium or reduced-sodium). Angie Fava. Olives. Fruits Canned fruit in a light or heavy syrup. Fried fruit. Fruit in cream or butter sauce. Meat and other protein foods Fatty cuts of meat. Ribs. Fried  meat. Berniece Salines. Sausage. Bologna and other processed lunch meats. Salami. Fatback. Hotdogs. Bratwurst. Salted nuts and seeds. Canned beans with added salt. Canned or smoked fish. Whole eggs or egg yolks. Chicken or [place] with skin. Dairy Whole or 2% milk, cream, and half-and-half. Whole or full-fat cream cheese. Whole-fat or sweetened yogurt. Full-fat cheese. Nondairy creamers. Whipped toppings. Processed cheese and cheese spreads. Fats and oils Butter. Stick margarine. Lard. Shortening. Ghee. Bacon fat. Tropical oils, such as coconut, palm kernel, or palm oil. Seasoning and other foods Salted popcorn and pretzels. Onion salt, garlic salt, seasoned salt, table salt, and sea salt. Worcestershire sauce. Tartar sauce. Barbecue sauce. Teriyaki sauce. Soy sauce, including reduced-sodium. Steak sauce. Canned and packaged gravies. Fish sauce. Oyster sauce. Cocktail sauce. Horseradish that you find on the shelf. Ketchup. Mustard. Meat flavorings and tenderizers. Bouillon cubes. Hot sauce and Tabasco sauce. Premade or packaged marinades. Premade or packaged taco seasonings. Relishes. Regular salad dressings. Where to find more information:  National Heart, Lung, and [place]: https://wilson-eaton.com/  American Heart Association: www.heart.org Summary  The DASH eating plan is a healthy eating plan that has been shown to reduce high blood pressure (hypertension). It may also reduce your risk for type 2 diabetes, heart disease, and stroke.  With the DASH eating plan, you should limit salt (sodium)  intake to 2,300 mg a day. If you have hypertension, you may need to reduce your sodium intake to 1,500 mg a day.  When on the DASH eating plan, aim to eat more fresh fruits and vegetables, whole grains, lean proteins, low-fat dairy, and heart-healthy fats.  Work with your health care provider or diet and nutrition specialist (dietitian) to adjust your eating plan to your individual calorie needs. This information is  not intended to replace advice given to you by your health care provider. Make sure you discuss any questions you have with your health care provider. Document Revised: 12/17/2016 Document Reviewed: 12/29/2015 Elsevier Patient Education  [place].    Coronary Calcium Scan A coronary calcium scan is an imaging test used to look for deposits of plaque in the inner lining of the blood vessels of the heart (coronary arteries). Plaque is made up of calcium, protein, and fatty substances. These deposits of plaque can partly clog and narrow the coronary arteries without producing any symptoms or warning signs. This puts a person at risk for a heart attack. This test is recommended for people who are at moderate risk for heart disease. The test can find plaque deposits before symptoms develop. Tell a health care provider about:  Any allergies you have.  All medicines you are taking, including vitamins, herbs, eye drops, creams, and over-the-counter medicines.  Any problems you or family members have had with anesthetic medicines.  Any blood disorders you have.  Any surgeries you have had.  Any medical conditions you have.  Whether you are pregnant or may be pregnant. What are the risks? Generally, this is a safe procedure. However, problems may occur, including:  Harm to a pregnant woman and her unborn baby. This test involves the use of radiation. Radiation exposure can be dangerous to a pregnant woman and her unborn baby. If you are pregnant or think you may be pregnant, you should not have this procedure done.  Slight increase in the risk of cancer. This is because of the radiation involved in the test. What happens before the procedure? Ask your health care provider for any specific instructions on how to prepare for this procedure. You may be asked to avoid products that contain caffeine, tobacco, or nicotine for 4 hours before the procedure. What happens during the  procedure?   You will undress and remove any jewelry from your neck or chest.  You will put on a hospital gown.  Sticky electrodes will be placed on your chest. The electrodes will be connected to an electrocardiogram (ECG) machine to record a tracing of the electrical activity of your heart.  You will lie down on a curved bed that is attached to the [place].  You may be given medicine to slow down your heart rate so that clear pictures can be created.  You will be moved into the CT scanner, and the CT scanner will take pictures of your heart. During this time, you will be asked to lie still and hold your breath for 2-3 seconds at a time while each picture of your heart is being taken. The procedure may vary among health care providers and hospitals. What happens after the procedure?  You can get dressed.  You can return to your normal activities.  It is up to you to get the results of your procedure. Ask your health care provider, or the department that is doing the procedure, when your results will be ready. Summary  A coronary calcium  scan is an imaging test used to look for deposits of plaque in the inner lining of the blood vessels of the heart (coronary arteries). Plaque is made up of calcium, protein, and fatty substances.  Generally, this is a safe procedure. Tell your health care provider if you are pregnant or may be pregnant.  Ask your health care provider for any specific instructions on how to prepare for this procedure.  A CT scanner will take pictures of your heart.  You can return to your normal activities after the scan is done. This information is not intended to replace advice given to you by your health care provider. Make sure you discuss any questions you have with your health care provider. Document Revised: 07/25/2018 Document Reviewed: 07/25/2018 Elsevier Patient Education  [place].

## 2019-10-08 NOTE — Assessment & Plan Note (Signed)
Discussed overall low ASCVD risk based on mildly elevated lipids, however, with family hx of early MI would recommend lipoprotein A or Coronary CT scoring to further assess risk. And if elevated could benefit from statin therapy.

## 2019-10-08 NOTE — Addendum Note (Signed)
Addended by: Waunita Schooner R on: 10/08/2019 10:40 AM   Modules accepted: Orders

## 2019-10-08 NOTE — Assessment & Plan Note (Signed)
Message from GI provider on same day - pt with joint stiffness and UC. Will get some blood work to evaluate for possible autoimmune disease given hx.

## 2019-10-08 NOTE — Progress Notes (Addendum)
Subjective:     MAJESTY STEHLIN is a 49 y.o. female presenting for discuss family history (Heart disease)     HPI   #Family hx of cardiac death - does get occasional heart palpation with catching breath - resolves quickly - will also get a neck soreness - not pain, not worse with movement -    Review of Systems  Constitutional: Negative for chills, fatigue and fever.  Respiratory: Negative for chest tightness and shortness of breath.   Cardiovascular: Positive for palpitations. Negative for chest pain and leg swelling.     Social History   Tobacco Use  Smoking Status Never Smoker  Smokeless Tobacco Never Used   Family History  Problem Relation Age of Onset  . Diabetes Father   . Heart disease Father   . Hypertension Father   . Hyperlipidemia Father   . Heart attack Father 33  . Other Father        heart bypass and heart transplant  . Diabetes Mother   . Hypertension Mother   . Dementia Mother        senile  . Heart attack Mother 26  . Diabetes Brother   . Heart disease Brother   . Heart failure Brother   . Heart attack Brother 69  . Heart disease Maternal Grandmother   . Heart attack Maternal Grandmother   . Other Paternal Grandmother        carotid blockage  . Stroke Paternal Grandmother   . Colon cancer Neg Hx   . Colon polyps Neg Hx   . Esophageal cancer Neg Hx   . Kidney disease Neg Hx   . Gallbladder disease Neg Hx          Objective:    BP Readings from Last 3 Encounters:  10/08/19 130/90  10/08/19 124/80  08/08/19 120/80   Wt Readings from Last 3 Encounters:  10/08/19 152 lb 8 oz (69.2 kg)  10/08/19 153 lb 8 oz (69.6 kg)  08/08/19 150 lb (68 kg)    BP 124/80   Pulse 77   Temp 98 F (36.7 C) (Temporal)   Ht 5' (1.524 m)   Wt 153 lb 8 oz (69.6 kg)   LMP 09/24/2019   SpO2 97%   BMI 29.98 kg/m    Physical Exam Constitutional:      General: She is not in acute distress.    Appearance: She is well-developed. She is not  diaphoretic.  HENT:     Right Ear: External ear normal.     Left Ear: External ear normal.     Nose: Nose normal.  Eyes:     Conjunctiva/sclera: Conjunctivae normal.  Neck:     Vascular: No carotid bruit.  Cardiovascular:     Rate and Rhythm: Normal rate and regular rhythm.     Heart sounds: No murmur heard.   Pulmonary:     Effort: Pulmonary effort is normal. No respiratory distress.     Breath sounds: Normal breath sounds. No wheezing.  Musculoskeletal:     Cervical back: Normal range of motion and neck supple.  Skin:    General: Skin is warm and dry.     Capillary Refill: Capillary refill takes less than 2 seconds.  Neurological:     Mental Status: She is alert. Mental status is at baseline.  Psychiatric:        Mood and Affect: Mood normal.        Behavior: Behavior normal.  The 10-year ASCVD risk score Mikey Bussing DC Brooke Bonito., et al., 2013) is: 0.9%   Values used to calculate the score:     Age: 71 years     Sex: Female     Is Non-Hispanic African American: No     Diabetic: No     Tobacco smoker: No     Systolic Blood Pressure: 264 mmHg     Is BP treated: No     HDL Cholesterol: 54.1 mg/dL     Total Cholesterol: 193 mg/dL  EKG: Sinus rhythm, no ST changes, no LVH    Assessment & Plan:   Problem List Items Addressed This Visit      Other   Family history of MI (myocardial infarction)    Normal BP, labs today. DASH diet discussed and encouraged exercise. Will consider additional risk assessment      Relevant Orders   Comprehensive metabolic panel   Lipid panel   CBC   Lipoprotein A (LPA)   Mixed hyperlipidemia    Discussed overall low ASCVD risk based on mildly elevated lipids, however, with family hx of early MI would recommend lipoprotein A or Coronary CT scoring to further assess risk. And if elevated could benefit from statin therapy.       Relevant Orders   Lipid panel   Lipoprotein A (LPA)   Palpitations - Primary    She notes some occasional  palpitations/checking in her chest which resolves quickly. BP normal and EKG wnl. Will get blood work      Relevant Orders   EKG 12-Lead (Completed)   Comprehensive metabolic panel   Lipid panel   CBC   Lipoprotein A (LPA)   Joint stiffness    Message from GI provider on same day - pt with joint stiffness and UC. Will get some blood work to evaluate for possible autoimmune disease given hx.       Relevant Orders   ANA   Cyclic citrul peptide antibody, IgG   Rheumatoid factor    Other Visit Diagnoses    Need for influenza vaccination       Relevant Orders   Flu Vaccine QUAD 36+ mos IM (Completed)       Return if symptoms worsen or fail to improve.  Lesleigh Noe, MD  This visit occurred during the SARS-CoV-2 public health emergency.  Safety protocols were in place, including screening questions prior to the visit, additional usage of staff PPE, and extensive cleaning of exam room while observing appropriate contact time as indicated for disinfecting solutions.

## 2019-10-08 NOTE — Assessment & Plan Note (Signed)
Normal BP, labs today. DASH diet discussed and encouraged exercise. Will consider additional risk assessment

## 2019-10-08 NOTE — Assessment & Plan Note (Signed)
She notes some occasional palpitations/checking in her chest which resolves quickly. BP normal and EKG wnl. Will get blood work

## 2019-10-08 NOTE — Progress Notes (Signed)
Referring Provider: Lesleigh Noe, MD Primary Care Physician:  Ebony Noe, MD  Chief complaint: ulcerative colitis   IMPRESSION:  Distal ulcerative colitis presenting with blood and mucous in the stool    - initially diagnosed in 1995    - Previously followed by Dr. Amedeo Ebony [name] and Dr. Olevia Ebony [name]    - Treated with steroids and mesalamine: No prior biologic therapy    - Previously required hospitalization and blood transfusion    - colonoscopy 08/04/18: mild-moderate colitis from the anal verge to 30 cm    - Labs 06/2018: fecal calprotectin 1578, ESR 29, CRP <1    - Labs 12/2018: fecal calprotectin 1111     - Labs 08/08/19: fecal calprotectin <16 after starting Rowasa Gastric and duodenal erosions likely due to NSAIDs on EGD 07/2018 Iron deficiency anemia    - labs 07/11/18: iron 32, ferritin 5.1, hemoglobin 11.7, MCV 82, RDW 14.9 History of colon polyps (per patient report) Posterior anal fissure Vitamin D deficiency  Distal ulcerative colitis:  Fecal calprotectin normalized with the addition of Rowasa enemas.  Ebony [name] continue to use serial fecal calprotectin to monitor her response to therapy.  Continue therapy. Plan flex sig with biopsies to evaluate for histologic remission.  Iron deficiency anemia: Follows labs to monitor response to IV therapy. I encouraged her to continue using po iron QOD.  Arthralgias: I reached out to Dr. Einar Ebony [name] who Ebony [name] arrange for initial labs, help determine if rheumatology consultation is needed.    PLAN: - Continue Lialda 4.8 g daily - Continue Rowasa 4g enemas BID, try reducing to QHS - Continue budesonide 9 mg daily - Avoid all NSAIDs - Continue iron supplements on an every other day schedule - Daily calcium 1200 daily and Vit D 800 IU daily - Continue pantoprazole 40 mg QAM - In October 2021: fecal calprotectin, ESR, and CRP 2 - Flex sigmoidoscopy (patient request) 12/2019 - Office visit after flexible sigmoidoscopy  HPI: Ebony Ebony [name] is a 49  y.o. female with distal ulcerative colitis. She returns in scheduled follow-up having been last seen 08/08/19.  The interval history is obtained through the patient and review of her electronic health record. Follows a vegetarian diet.   IBD history: Diagnosed 1995 (Dr. Amedeo Ebony [name]) with multiple flares over the years; stopped all medical therapy in 2001 when she lost insurance Surgical history: None  Current medications: Lialda, budesonide, Rowasa Prior medications: steroids; Asacol; Did not tolerate enemas/suppositories; started Lialda 4.8g 08/2018, budeonside 9 mg daily Last colonoscopy: 08/04/18: distal colitis from anal verge to 30 cm Extraintestinal manifestations: ? arthralgias Other medical history: Iron deficiency anemia requiring IV iron x 2  Returns in scheduled follow-up. Feeling better on Lialda, now also with Rowasa enemas BID. Having 2-3 BM daily. Feels like her stools are still not normal. Less urgency. But, she is not seeing as much mucous. No blood.  Previously having up to 6 bowel movements daily with significant ugency.    Energy has improved. Appetite remains "okay." Continues to gain weight.  Continues to take probiotic, Vitamin D, and she started to take biotin for hair loss.   Stiff in her fingers and left knee. Feels like diffusely her joints are worse. Worse in the morning but the stiffness improves after an hour. Notes increased joint problems with flares in the past.     Feels like th rash on her left elbow has improved.   Not using iron supplements.     Prior imaging studies include: Abdominal ultrasound June  2001: Normal Barium esophagram in September 2001: small sliding hiatal hernia, otherwise unremarkable  Recent labs: 07/11/2018: Iron 32, ferritin 5.1, CRP less than 1.0, sedimentation rate 29. hemoglobin 11.7, MCV 82, RDW 14.9, platelets 380.  Fecal calprotectin 1578. 11/16/18: normal CMP, ferritin 4.9, WBC 3.7, hgb 12.3, MCV 85.4, RDW 15.3, platelets 321,  glucose 87, HgbA1C 5.7. TSH 2.08.  12/29/18: Fecal calprotectin 1,111.  07/11/19: ferritin 34, CRP <1, ESR 4 07/30/19: Fecal calprotectin 1,234 08/08/19: iron 112, ferritin 25.6, hemoglobin 16, ESR 7, CRP <1, HBSAg neg, HBcAb tot neg, HCV neg, HIV nonreactive, quanitferon gold negative, fecal calprotectin <16   Past Medical History:  Diagnosis Date  . Anemia   . Arthritis   . Blood transfusion without reported diagnosis unknown   for tx uc  . Colon polyps 1997 or 1998?  [facility] bleeding   . Ulcerative colitis [facility])     Past Surgical History:  Procedure Laterality Date  . BASAL CELL CARCINOMA EXCISION  2020   left breast  . COLONOSCOPY  1997 or 1998?    Current Outpatient Medications  Medication Sig Dispense Refill  . augmented betamethasone dipropionate (DIPROLENE-AF) 0.05 % cream APPLY ON RASH TWICE DAILY AS NEEDED FOR FLARES    . BIOTIN PO Take 1 tablet by mouth daily.    . budesonide (ENTOCORT EC) 3 MG 24 hr capsule TAKE 3 CAPSULES BY MOUTH ONCE DAILY 90 capsule 3  . cholecalciferol (VITAMIN D3) 25 MCG (1000 UNIT) tablet Take 1,000 Units by mouth daily.    . Flaxseed, Linseed, (FLAXSEED OIL PO) Take 1 tablet by mouth as needed.    . mesalamine (LIALDA) 1.2 g EC tablet Take 4 tablets (4.8 g total) by mouth daily with breakfast. 120 tablet 3  . mesalamine (ROWASA) 4 g enema Place 60 mLs (4 g total) rectally 2 (two) times daily. 6720 mL 0  . OVER THE COUNTER MEDICATION Take 1 tablet by mouth as needed. Pt taking Tumeric    . pantoprazole (PROTONIX) 40 MG tablet Take 1 tablet by mouth once daily 60 tablet 3  . Probiotic Product (PROBIOTIC DAILY PO) Take 1 tablet by mouth as needed.    . traMADol (ULTRAM) 50 MG tablet Take 1 tablet (50 mg total) by mouth every 6 (six) hours as needed. 30 tablet 0   Current Facility-Administered Medications  Medication Dose Route Frequency Provider Last Rate Last Admin  . 0.9 %  sodium chloride infusion  500 mL Intravenous Once [place], MD         Allergies as of 10/08/2019  . (No Known Allergies)    Family History  Problem Relation Age of Onset  . Diabetes Father   . Heart disease Father   . Hypertension Father   . Hyperlipidemia Father   . Heart attack Father 58  . Other Father        heart bypass and heart transplant  . Diabetes Mother   . Hypertension Mother   . Dementia Mother        senile  . Heart attack Mother 24  . Diabetes Brother   . Heart disease Brother   . Heart failure Brother   . Heart attack Brother 85  . Heart disease Maternal Grandmother   . Heart attack Maternal Grandmother   . Other Paternal Grandmother        carotid blockage  . Stroke Paternal Grandmother   . Colon cancer Neg Hx   . Colon polyps Neg Hx   . Esophageal  cancer Neg Hx   . Kidney disease Neg Hx   . Gallbladder disease Neg Hx     Social History   Socioeconomic History  . Marital status: Single    Spouse name: Not on file  . Number of children: 1  . Years of education: High school  . Highest education level: Not on file  Occupational History  . Occupation: Sales  Tobacco Use  . Smoking status: Never Smoker  . Smokeless tobacco: Never Used  Vaping Use  . Vaping Use: Never used  Substance and Sexual Activity  . Alcohol use: Yes    Alcohol/week: 0.0 standard drinks    Comment: Rarely-about 2 times a year if that  . Drug use: No  . Sexual activity: Yes    Birth control/protection: Condom  Other Topics Concern  . Not on file  Social History Narrative   11/16/18   From: the area   Living: son tyler and boyfriend Dominica Severin (15 yos)   Work: in Press photographer      Family: Has son Dorothea Ogle (31 yo), boyfriend      Enjoys: cooking for other people, walking/hiking      Exercise: not regular routine currently   Diet: small meals, only once per day      Safety   Seat belts: Yes    Guns: Yes  and secure   Safe in relationships: Yes    Social Determinants of Health   Financial Resource Strain: Low Risk   . Difficulty of  Paying Living Expenses: Not hard at all  Food Insecurity:   . Worried About Charity fundraiser in the Last Year: Not on file  . Ran Out of Food in the Last Year: Not on file  Transportation Needs:   . Lack of Transportation (Medical): Not on file  . Lack of Transportation (Non-Medical): Not on file  Physical Activity:   . Days of Exercise per Week: Not on file  . Minutes of Exercise per Session: Not on file  Stress:   . Feeling of Stress : Not on file  Social Connections:   . Frequency of Communication with Friends and Family: Not on file  . Frequency of Social Gatherings with Friends and Family: Not on file  . Attends Religious Services: Not on file  . Active Member of Clubs or Organizations: Not on file  . Attends Archivist Meetings: Not on file  . Marital Status: Not on file  Intimate Partner Violence:   . Fear of Current or Ex-Partner: Not on file  . Emotionally Abused: Not on file  . Physically Abused: Not on file  . Sexually Abused: Not on file      Physical Exam: General:   Alert, well-nourished, pleasant and cooperative in NAD.  No pallor. Head:  Normocephalic and atraumatic.  No alopecia. Eyes:  Sclera clear, no icterus.   Conjunctiva pink. Abdomen:  Soft, nontender, normal bowel sounds. No rebound or guarding. No hepatosplenomegaly Neurologic:  Alert and  oriented x4;  grossly nonfocal Skin:  No rash or bruise. Psych:  Alert and cooperative. Normal mood and affect.  Ebony Ebony [name] L. Tarri Glenn, MD, MPH [facility] 10/08/2019, 10:06 AM

## 2019-10-08 NOTE — Patient Instructions (Signed)
If you are age 47 or older, your body mass index should be between 23-30. Your Body mass index is 29.78 kg/m. If this is out of the aforementioned range listed, please consider follow up with your Primary Care Provider.  If you are age 106 or younger, your body mass index should be between 19-25. Your Body mass index is 29.78 kg/m. If this is out of the aformentioned range listed, please consider follow up with your Primary Care Provider.   You have been scheduled for a flexible sigmoidoscopy. Please follow the written instructions given to you at your visit today. If you use inhalers (even only as needed), please bring them with you on the day of your procedure.  Thank you for entrusting me with your care and choosing [facility].  Dr Tarri Glenn

## 2019-10-11 ENCOUNTER — Other Ambulatory Visit: Payer: Self-pay | Admitting: Family Medicine

## 2019-10-11 DIAGNOSIS — R768 Other specified abnormal immunological findings in serum: Secondary | ICD-10-CM

## 2019-10-11 DIAGNOSIS — M2560 Stiffness of unspecified joint, not elsewhere classified: Secondary | ICD-10-CM

## 2019-10-11 LAB — ANTI-NUCLEAR AB-TITER (ANA TITER)
ANA TITER: 1:80 {titer} — ABNORMAL HIGH
ANA Titer 1: 1:40 {titer} — ABNORMAL HIGH

## 2019-10-11 LAB — ANA: Anti Nuclear Antibody (ANA): POSITIVE — AB

## 2019-10-11 LAB — CYCLIC CITRUL PEPTIDE ANTIBODY, IGG: Cyclic Citrullin Peptide Ab: <16 UNITS

## 2019-10-11 LAB — RHEUMATOID FACTOR: Rheumatoid fact SerPl-aCnc: <14 IU/mL (ref <14)

## 2019-10-11 LAB — LIPOPROTEIN A (LPA): Lipoprotein (a): 23 nmol/L (ref <75)

## 2019-10-18 ENCOUNTER — Encounter: Payer: Self-pay | Admitting: Family Medicine

## 2019-10-23 ENCOUNTER — Encounter: Payer: Self-pay | Admitting: Family Medicine

## 2019-10-30 ENCOUNTER — Encounter: Payer: Self-pay | Admitting: Family Medicine

## 2019-10-30 ENCOUNTER — Other Ambulatory Visit: Payer: Self-pay | Admitting: Gastroenterology

## 2019-11-05 ENCOUNTER — Other Ambulatory Visit: Payer: Self-pay | Admitting: Obstetrics and Gynecology

## 2019-11-05 DIAGNOSIS — Z1231 Encounter for screening mammogram for malignant neoplasm of breast: Secondary | ICD-10-CM

## 2019-11-19 ENCOUNTER — Encounter: Payer: Self-pay | Admitting: Family Medicine

## 2019-11-19 ENCOUNTER — Other Ambulatory Visit: Payer: Self-pay

## 2019-11-19 ENCOUNTER — Ambulatory Visit (INDEPENDENT_AMBULATORY_CARE_PROVIDER_SITE_OTHER): Payer: BC Managed Care – PPO | Admitting: Family Medicine

## 2019-11-19 VITALS — BP 118/82 | HR 81 | Temp 97.4°F | Ht 60.0 in | Wt 151.5 lb

## 2019-11-19 DIAGNOSIS — R7303 Prediabetes: Secondary | ICD-10-CM | POA: Insufficient documentation

## 2019-11-19 DIAGNOSIS — Z0000 Encounter for general adult medical examination without abnormal findings: Secondary | ICD-10-CM | POA: Diagnosis not present

## 2019-11-19 LAB — POCT GLYCOSYLATED HEMOGLOBIN (HGB A1C): Hemoglobin A1C: 5.2 % (ref 4.0–5.6)

## 2019-11-19 NOTE — Progress Notes (Signed)
Annual Exam   Chief Complaint:  Chief Complaint  Patient presents with  . Annual Exam    History of Present Illness:  [name] is a 49 y.o. G0P0000 who LMP was No LMP recorded. (Menstrual status: Irregular Periods)., presents today for her annual examination.     Nutrition Diet: small meals, once per day Exercise: trying to walk and weight training She does get adequate calcium and Vitamin D in her diet.   Social History   Tobacco Use  Smoking Status Never Smoker  Smokeless Tobacco Never Used   Social History   Substance and Sexual Activity  Alcohol Use Yes  . Alcohol/week: 0.0 standard drinks   Comment: Rarely-about 2 times a year if that   Social History   Substance and Sexual Activity  Drug Use No    Safety The patient wears seatbelts: yes.     The patient feels safe at home and in their relationships: yes.  General Health Dentist in the last year: Yes Eye doctor: has been a few years   Menstrual Her menses are irregular. All over the place. Will get continuous spotting for up to 1 month.   GYN She is single partner, contraception - condoms most of the time.    Cervical Cancer Screening:   Last Pap:    2018 Results were: no abnormalities /neg HPV DNA    Breast Cancer Screening There is no FH of breast cancer. There is no FH of ovarian cancer. BRCA screening Not Indicated.  Discussed that for average risk women between age 59-49 screening may reduce the risk of breast cancer death, however, at a lower rate than those over age 73. And that the the false-positive rates resulting in unnecessary biopsies with more screening is higher. The balance of benefits vs harms likely improves as you progress through your 40s. The patient does want a mammogram this year.   Colon Cancer Screening:  Age 3-75 yo - benefits outweigh the risk. Adults 50-85 yo who have never been screened benefit.  Benefits: 134000 people in 2016 will be diagnosed and 49,000  will die - early detection helps Harms: Complications 2/2 to colonoscopy High Risk (Colonoscopy): genetic disorder (Lynch syndrome or familial adenomatous polyposis), personal hx of IBD, previous adenomatous polyp, or previous colorectal cancer, FamHx start 10 years before the age at diagnosis, increased in males and black race  Options:  FIT - looks for hemoglobin (blood in the stool) - specific and fairly sensitive - must be done annually Cologuard - looks for DNA and blood - more sensitive - therefore can have more false positives, every 3 years Colonoscopy - every 10 years if normal - sedation, bowl prep, must have someone drive you  Shared decision making and the patient had decided to do up to date.  Weight Wt Readings from Last 3 Encounters:  11/19/19 151 lb 8 oz (68.7 kg)  10/08/19 152 lb 8 oz (69.2 kg)  10/08/19 153 lb 8 oz (69.6 kg)   Patient has high BMI  BMI Readings from Last 1 Encounters:  11/19/19 29.59 kg/m     Chronic disease screening Blood pressure monitoring:  BP Readings from Last 3 Encounters:  11/19/19 118/82  10/08/19 130/90  10/08/19 124/80    Lipid Monitoring: Indication for screening: age >58, obesity, diabetes, family hx, CV risk factors.  Lipid screening: Yes  Lab Results  Component Value Date   CHOL 190 10/08/2019   HDL 66.10 10/08/2019   LDLCALC 101 (H) 10/08/2019  TRIG 119.0 10/08/2019   CHOLHDL 3 10/08/2019     Diabetes Screening: age >47, overweight, family hx, PCOS, hx of gestational diabetes, at risk ethnicity Diabetes Screening screening: Yes  Lab Results  Component Value Date   HGBA1C 5.2 11/19/2019     Past Medical History:  Diagnosis Date  . Anemia   . Arthritis   . Blood transfusion without reported diagnosis unknown   for tx uc  . Colon polyps 1997 or 1998?  [facility] bleeding   . Ulcerative colitis [facility])     Past Surgical History:  Procedure Laterality Date  . BASAL CELL CARCINOMA EXCISION  2020   left breast   . COLONOSCOPY  1997 or 1998?    Prior to Admission medications   Medication Sig Start Date End Date Taking? Authorizing Provider  augmented betamethasone dipropionate (DIPROLENE-AF) 0.05 % cream APPLY ON RASH TWICE DAILY AS NEEDED FOR FLARES 11/01/18  Yes [provider]  BIOTIN PO Take 1 tablet by mouth daily.   Yes [provider]  budesonide (ENTOCORT EC) 3 MG 24 hr capsule TAKE 3 CAPSULES BY MOUTH ONCE DAILY 10/30/19  Yes Thornton Park, MD  cholecalciferol (VITAMIN D3) 25 MCG (1000 UNIT) tablet Take 1,000 Units by mouth daily.   Yes [provider]  Flaxseed, Linseed, (FLAXSEED OIL PO) Take 1 tablet by mouth as needed.   Yes [provider]  mesalamine (LIALDA) 1.2 g EC tablet Take 4 tablets (4.8 g total) by mouth daily with breakfast. 08/08/19  Yes Thornton Park, MD  mesalamine (ROWASA) 4 g enema PLACE 60 MLS RECTALLY TWICE DAILY 10/30/19  Yes Thornton Park, MD  OVER THE COUNTER MEDICATION Take 1 tablet by mouth as needed. Pt taking Tumeric   Yes [provider]  pantoprazole (PROTONIX) 40 MG tablet Take 1 tablet by mouth once daily 11/30/18  Yes Beavers, Joelene Millin, MD  Probiotic Product (PROBIOTIC DAILY PO) Take 1 tablet by mouth as needed.   Yes [provider]  traMADol (ULTRAM) 50 MG tablet Take 1 tablet (50 mg total) by mouth every 6 (six) hours as needed. 62/26/33  Yes Copland, Alicia B, PA-C    No Known Allergies  Gynecologic History: No LMP recorded. (Menstrual status: Irregular Periods).  Obstetric History: G0P0000  Social History   Socioeconomic History  . Marital status: Single    Spouse name: Not on file  . Number of children: 1  . Years of education: High school  . Highest education level: Not on file  Occupational History  . Occupation: Sales  Tobacco Use  . Smoking status: Never Smoker  . Smokeless tobacco: Never Used  Vaping Use  . Vaping Use: Never used  Substance and Sexual Activity  .  Alcohol use: Yes    Alcohol/week: 0.0 standard drinks    Comment: Rarely-about 2 times a year if that  . Drug use: No  . Sexual activity: Yes    Birth control/protection: Condom  Other Topics Concern  . Not on file  Social History Narrative   11/16/18   From: the area   Living: son tyler and boyfriend Dominica Severin (15 yos)   Work: in Press photographer      Family: Has son Dorothea Ogle (57 yo), boyfriend      Enjoys: cooking for other people, walking/hiking      Exercise: walking and weight training   Diet: small meals, only once per day      Safety   Seat belts: Yes    Guns: Yes  and secure   Safe in relationships: Yes    Social Determinants of Health   Financial Resource Strain:   . Difficulty of Paying Living Expenses: Not on file  Food Insecurity:   . Worried About Charity fundraiser in the Last Year: Not on file  . Ran Out of Food in the Last Year: Not on file  Transportation Needs:   . Lack of Transportation (Medical): Not on file  . Lack of Transportation (Non-Medical): Not on file  Physical Activity:   . Days of Exercise per Week: Not on file  . Minutes of Exercise per Session: Not on file  Stress:   . Feeling of Stress : Not on file  Social Connections:   . Frequency of Communication with Friends and Family: Not on file  . Frequency of Social Gatherings with Friends and Family: Not on file  . Attends Religious Services: Not on file  . Active Member of Clubs or Organizations: Not on file  . Attends Archivist Meetings: Not on file  . Marital Status: Not on file  Intimate Partner Violence:   . Fear of Current or Ex-Partner: Not on file  . Emotionally Abused: Not on file  . Physically Abused: Not on file  . Sexually Abused: Not on file    Family History  Problem Relation Age of Onset  . Diabetes Father   . Heart disease Father   . Hypertension Father   . Hyperlipidemia Father   . Heart attack Father 81  . Other Father        heart bypass and heart transplant  .  Diabetes Mother   . Hypertension Mother   . Dementia Mother        senile  . Heart attack Mother 61  . Diabetes Brother   . Heart disease Brother   . Heart failure Brother   . Heart attack Brother 36  . Heart disease Maternal Grandmother   . Heart attack Maternal Grandmother   . Other Paternal Grandmother        carotid blockage  . Stroke Paternal Grandmother   . Colon cancer Neg Hx   . Colon polyps Neg Hx   . Esophageal cancer Neg Hx   . Kidney disease Neg Hx   . Gallbladder disease Neg Hx     Review of Systems  Constitutional: Negative for chills and fever.  HENT: Negative for congestion and sore throat.   Eyes: Negative for blurred vision and double vision.  Respiratory: Negative for shortness of breath.   Cardiovascular: Negative for chest pain.  Gastrointestinal: Negative for heartburn, nausea and vomiting.  Genitourinary: Negative.   Musculoskeletal: Negative.  Negative for myalgias.  Skin: Negative for rash.  Neurological: Negative for dizziness and headaches.  Endo/Heme/Allergies: Does not bruise/bleed easily.  Psychiatric/Behavioral: Negative for depression. The patient is not nervous/anxious.      Physical Exam BP 118/82   Pulse 81   Temp (!) 97.4 F (36.3 C) (Temporal)   Ht 5' (1.524 m)   Wt 151 lb 8 oz (68.7 kg)   SpO2 99%   BMI 29.59 kg/m    BP Readings from Last 3 Encounters:  11/19/19 118/82  10/08/19 130/90  10/08/19 124/80      Physical Exam Constitutional:      General: She is not in acute distress.    Appearance: She is well-developed. She is not diaphoretic.  HENT:     Head: Normocephalic and atraumatic.     Right Ear:  External ear normal.     Left Ear: External ear normal.     Nose: Nose normal.  Eyes:     General: No scleral icterus.    Conjunctiva/sclera: Conjunctivae normal.  Cardiovascular:     Rate and Rhythm: Normal rate and regular rhythm.     Heart sounds: No murmur heard.   Pulmonary:     Effort: Pulmonary effort is  normal. No respiratory distress.     Breath sounds: Normal breath sounds. No wheezing.  Abdominal:     General: Bowel sounds are normal. There is no distension.     Palpations: Abdomen is soft. There is no mass.     Tenderness: There is no abdominal tenderness. There is no guarding or rebound.  Musculoskeletal:        General: Normal range of motion.     Cervical back: Neck supple.  Lymphadenopathy:     Cervical: No cervical adenopathy.  Skin:    General: Skin is warm and dry.     Capillary Refill: Capillary refill takes less than 2 seconds.  Neurological:     Mental Status: She is alert and oriented to person, place, and time.     Deep Tendon Reflexes: Reflexes normal.  Psychiatric:        Behavior: Behavior normal.      Results:  PHQ-9:    Office Visit from 11/16/2018 in [place] at [facility]  PHQ-9 Total Score 3        Assessment: 49 y.o. G0P0000 female here for routine annual physical examination.  Plan: Problem List Items Addressed This Visit      Other   Prediabetes   Relevant Orders   POCT glycosylated hemoglobin (Hb A1C) (Completed)    Other Visit Diagnoses    Annual physical exam    -  Primary      Screening: -- Blood pressure screen normal -- cholesterol screening: not due for screening -- Weight screening: overweight: continue to monitor -- Diabetes Screening: will obtain -- Nutrition: Encouraged healthy diet  The 10-year ASCVD risk score Mikey Bussing DC Jr., et al., 2013) is: 0.6%   Values used to calculate the score:     Age: 18 years     Sex: Female     Is Non-Hispanic African American: No     Diabetic: No     Tobacco smoker: No     Systolic Blood Pressure: 161 mmHg     Is BP treated: No     HDL Cholesterol: 66.1 mg/dL     Total Cholesterol: 190 mg/dL  -- Statin therapy for Age 85-75 with CVD risk >7.5%  Psych -- Depression screening (PHQ-9):    Office Visit from 11/16/2018 in [place] at [facility]  PHQ-9 Total  Score 3       Safety -- tobacco screening: not using -- alcohol screening:  low-risk usage. -- no evidence of domestic violence or intimate partner violence.   Cancer Screening -- pap smear not collected per ASCCP guidelines -- family history of breast cancer screening: done. not at high risk. -- Mammogram - due in December -- Colon cancer (age 44+)-- follows with GI due to Ulcerative Colitis - UTD  Immunizations Immunization History  Administered Date(s) Administered  . Influenza,inj,Quad PF,6+ Mos 12/07/2017, 11/16/2018, 10/08/2019  . PFIZER SARS-COV-2 Vaccination 08/22/2019  . Pneumococcal Polysaccharide-23 01/03/2019  . Tdap 04/14/2006, 11/16/2018    -- flu vaccine up to date -- TDAP q10 years up to date -- PPSV-23 (19-64 with chronic  disease or smoking) up to date -- Covid-19 Vaccine in the process   Encouraged healthy diet and exercise. Encouraged regular vision and dental care.   Lesleigh Noe, MD

## 2019-11-19 NOTE — Patient Instructions (Addendum)
Let me know if you want to try physical therapy for the dizziness    Menopause Menopause may increase your risk for:  Loss of bone (osteoporosis), which causes bone breaks (fractures).  Depression.  Hardening and narrowing of the arteries (atherosclerosis), which can cause heart attacks and strokes. What are the causes? This condition is usually caused by a natural change in hormone levels that happens as you get older. The condition may also be caused by surgery to remove both ovaries (bilateral oophorectomy). Follow these instructions at home: Lifestyle  Do not use any products that contain nicotine or tobacco, such as cigarettes and e-cigarettes. If you need help quitting, ask your health care provider.  Get at least 30 minutes of physical activity on 5 or more days each week.  Avoid alcoholic and caffeinated beverages, as well as spicy foods. This may help prevent hot flashes.  Get 7-8 hours of sleep each night.  If you have hot flashes, try: ? Dressing in layers. ? Avoiding things that may trigger hot flashes, such as spicy food, hot drinks, alcohol, caffeine, warm places, or stress. ? Taking slow, deep breaths when a hot flash starts. ? Keeping a fan in your home and office.  Find ways to manage stress: regular exercise, meditation, yoga, qigong, Tai Chi, biofeedback, acupuncture or massage  Consider going to group therapy with other women who are having menopause symptoms. Ask your health care provider about recommended group therapy meetings.  Staying cool while sleeping: dress in light clothing, use layed bedding that can be easily removed, Sleep with a fan nearby, put an ice pack under your pillow and flip your pillow regularly  Eating and drinking  Eat a healthy, balanced diet that contains whole grains, lean protein, low-fat dairy, and plenty of fruits and vegetables.  Your health care provider may recommend adding more soy to your diet. Foods that contain soy  include tofu, tempeh, and soy milk.  Eat plenty of foods that contain calcium and vitamin D for bone health. Items that are rich in calcium include low-fat milk, yogurt, beans, almonds, sardines, broccoli, and kale. Medicines  Non-prescription medications for Hot Flashes ? Soy - eat 1-2 servings of soy foods daily ? Herbs: like black cohosh have shown some improvement with hot flashes  Talk with your health care provider before starting any herbal supplements. If prescribed, take vitamins and supplements as told by your health care provider. These may include: ? Calcium. Women age 79 and older should get 1,200 mg (milligrams) of calcium every day. ? Vitamin D. Women need 600-800 International Units of vitamin D each day.  Prescription Medications ? Hormone Treatment: increased risk of breast cancer and cardiovascular disease. Should be used for a short time and in women under 60 have a lower risk overall if they take it ? Non-Hormone Treatment: Paroxetine which is an antidepressant, has been shown to reduce Hot Flashes

## 2019-12-10 ENCOUNTER — Other Ambulatory Visit: Payer: BC Managed Care – PPO

## 2019-12-10 DIAGNOSIS — K51311 Ulcerative (chronic) rectosigmoiditis with rectal bleeding: Secondary | ICD-10-CM | POA: Diagnosis not present

## 2019-12-13 LAB — CALPROTECTIN, FECAL: Calprotectin, Fecal: <16 ug/g (ref 0–120)

## 2019-12-19 ENCOUNTER — Other Ambulatory Visit: Payer: Self-pay

## 2019-12-19 ENCOUNTER — Ambulatory Visit (AMBULATORY_SURGERY_CENTER): Payer: BC Managed Care – PPO | Admitting: Gastroenterology

## 2019-12-19 ENCOUNTER — Encounter: Payer: Self-pay | Admitting: Gastroenterology

## 2019-12-19 VITALS — BP 138/65 | HR 82 | Temp 97.0°F | Resp 13 | Ht 60.0 in | Wt 152.0 lb

## 2019-12-19 DIAGNOSIS — K5190 Ulcerative colitis, unspecified, without complications: Secondary | ICD-10-CM | POA: Diagnosis not present

## 2019-12-19 DIAGNOSIS — K51311 Ulcerative (chronic) rectosigmoiditis with rectal bleeding: Secondary | ICD-10-CM | POA: Diagnosis not present

## 2019-12-19 MED ORDER — SODIUM CHLORIDE 0.9 % IV SOLN: 500.0000 mL | INTRAVENOUS | Status: DC

## 2019-12-19 NOTE — Progress Notes (Signed)
Called to room to assist during endoscopic procedure.  Patient ID and intended procedure confirmed with present staff. Received instructions for my participation in the procedure from the performing physician.  

## 2019-12-19 NOTE — Progress Notes (Signed)
Report to PACU, RN, vss, BBS= Clear.  

## 2019-12-19 NOTE — Patient Instructions (Signed)
YOU HAD AN ENDOSCOPIC PROCEDURE TODAY AT THE [facility]:   Refer to the procedure report that was given to you for any specific questions about what was found during the examination.  If the procedure report does not answer your questions, please call your gastroenterologist to clarify.  If you requested that your care partner not be given the details of your procedure findings, then the procedure report has been included in a sealed envelope for you to review at your convenience later.  YOU SHOULD EXPECT: Some feelings of bloating in the abdomen. Passage of more gas than usual.  Walking can help get rid of the air that was put into your GI tract during the procedure and reduce the bloating. If you had a lower endoscopy (such as a colonoscopy or flexible sigmoidoscopy) you may notice spotting of blood in your stool or on the toilet paper. If you underwent a bowel prep for your procedure, you may not have a normal bowel movement for a few days.  Please Note:  You might notice some irritation and congestion in your nose or some drainage.  This is from the oxygen used during your procedure.  There is no need for concern and it should clear up in a day or so.  SYMPTOMS TO REPORT IMMEDIATELY:   Following lower endoscopy (colonoscopy or flexible sigmoidoscopy):  Excessive amounts of blood in the stool  Significant tenderness or worsening of abdominal pains  Swelling of the abdomen that is new, acute  Fever of 100F or higher  For urgent or emergent issues, a gastroenterologist can be reached at any hour by calling [phone]. Do not use MyChart messaging for urgent concerns.    DIET:  We do recommend a small meal at first, but then you may proceed to your regular diet.  Drink plenty of fluids but you should avoid alcoholic beverages for 24 hours.  ACTIVITY:  You should plan to take it easy for the rest of today and you should NOT DRIVE or use heavy machinery until tomorrow (because  of the sedation medicines used during the test).    FOLLOW UP: Our staff will call the number listed on your records 48-72 hours following your procedure to check on you and address any questions or concerns that you may have regarding the information given to you following your procedure. If we do not reach you, we will leave a message.  We will attempt to reach you two times.  During this call, we will ask if you have developed any symptoms of COVID 19. If you develop any symptoms (ie: fever, flu-like symptoms, shortness of breath, cough etc.) before then, please call [phone].  If you test positive for Covid 19 in the 2 weeks post procedure, please call and report this information to us.    If any biopsies were taken you will be contacted by phone or by letter within the next 1-3 weeks.  Please call us at [phone] if you have not heard about the biopsies in 3 weeks.    SIGNATURES/CONFIDENTIALITY: You and/or your care partner have signed paperwork which will be entered into your electronic medical record.  These signatures attest to the fact that that the information above on your After Visit Summary has been reviewed and is understood.  Full responsibility of the confidentiality of this discharge information lies with you and/or your care-partner. 

## 2019-12-19 NOTE — Op Note (Signed)
[facility] Patient Name: Ebony [name] Procedure Date: 12/19/2019 8:39 AM MRN: [id number] Endoscopist: Thornton Park MD, MD Age: 49 Referring MD:  Date of Birth: [birth year] Gender: Female Account #: [id number] Procedure:                Flexible Sigmoidoscopy Indications:              Assess therapeutic response to therapy of                            left-sided chronic ulcerative colitis                           Distal ulcerative colitis presenting with blood and                            mucous in the stool                           - initially diagnosed in 1995                           - Previously followed by Dr. Amedeo Plenty and Dr. Olevia Perches                           - Treated with steroids and mesalamine: No prior                            biologic therapy                           - Previously required hospitalization and blood                            transfusion                           - colonoscopy 08/04/18: mild-moderate colitis from                            the anal verge to 30 cm                           - Labs 06/2018: fecal calprotectin 1578, ESR 29, CRP                            <1                           - Labs 12/2018: fecal calprotectin 1111                           - Labs 08/08/19: fecal calprotectin <16 after                            starting Rowasa                           -  Sigmoidoscopy planned to monitor response to                            therapy                           Iron deficiency anemia                           - labs 07/11/18: iron 32, ferritin 5.1, hemoglobin                            11.7, MCV 82, RDW 14.9                           History of colon polyps (per patient report)                           History of posterior anal fissure Medicines:                Monitored Anesthesia Care Procedure:                Pre-Anesthesia Assessment:                           - Prior to the procedure, a History and Physical                             was performed, and patient medications and                            allergies were reviewed. The patient's tolerance of                            previous anesthesia was also reviewed. The risks                            and benefits of the procedure and the sedation                            options and risks were discussed with the patient.                            All questions were answered, and informed consent                            was obtained. Prior Anticoagulants: The patient has                            taken no previous anticoagulant or antiplatelet                            agents. ASA Grade Assessment: II - A patient with  mild systemic disease. After reviewing the risks                            and benefits, the patient was deemed in                            satisfactory condition to undergo the procedure.                           After obtaining informed consent, the scope was                            passed under direct vision. The Colonoscope was                            introduced through the anus and advanced to the the                            left transverse colon. The flexible sigmoidoscopy                            was accomplished without difficulty. The patient                            tolerated the procedure well. The quality of the                            bowel preparation was good. Scope In: 9:07:09 AM Scope Out: 9:19:49 AM Total Procedure Duration: 0 hours 12 minutes 40 seconds  Findings:                 The perianal and digital rectal examinations were                            normal.                           The colon (entire examined portion) appeared normal                            except for mild patchy erythema in the rectum that                            may have been related to recent enema use. Biopsies                            were taken throughout the examined  colon with a                            cold forceps for histology. Biopsies were taken                            with a cold forceps for histology. Estimated blood  loss was minimal.                           The exam was otherwise without abnormality. Complications:            No immediate complications. Estimated blood loss:                            Minimal. Estimated Blood Loss:     Estimated blood loss was minimal. Impression:               - The entire examined colon is essentially normal.                            Biopsied. Recommendation:           - Patient has a contact number available for                            emergencies. The signs and symptoms of potential                            delayed complications were discussed with the                            patient. Return to normal activities tomorrow.                            Written discharge instructions were provided to the                            patient.                           - Resume previous diet.                           - Continue present medications including Lialda 4.8                            g daily, Rowasa 4g enemas QHS, and budesonide 9 mg                            daily.                           - Await pathology results.                           - Office follow-up to review these results and                            management plans. Thornton Park MD, MD 12/19/2019 9:29:42 AM This report has been signed electronically.

## 2019-12-21 ENCOUNTER — Telehealth: Payer: Self-pay

## 2019-12-21 NOTE — Telephone Encounter (Signed)
°  Follow up Call-  Call back number 12/19/2019 08/04/2018  Post procedure Call Back phone  # [phone]  Permission to leave phone message Yes Yes  Some recent data might be hidden     Patient questions:  Do you have a fever, pain , or abdominal swelling? No. Pain Score  0 *  Have you tolerated food without any problems? Yes.    Have you been able to return to your normal activities? Yes.    Do you have any questions about your discharge instructions: Diet   No. Medications  No. Follow up visit  No.  Do you have questions or concerns about your Care? No.  Actions: * If pain score is 4 or above: No action needed, pain <4.  1. Have you developed a fever since your procedure? no  2.   Have you had an respiratory symptoms (SOB or cough) since your procedure? no  3.   Have you tested positive for COVID 19 since your procedure no  4.   Have you had any family members/close contacts diagnosed with the COVID 19 since your procedure?  no   If yes to any of these questions please route to Joylene John, RN and Joella Prince, RN

## 2019-12-24 DIAGNOSIS — M791 Myalgia, unspecified site: Secondary | ICD-10-CM | POA: Diagnosis not present

## 2019-12-24 DIAGNOSIS — R5383 Other fatigue: Secondary | ICD-10-CM | POA: Diagnosis not present

## 2019-12-24 DIAGNOSIS — K5190 Ulcerative colitis, unspecified, without complications: Secondary | ICD-10-CM | POA: Diagnosis not present

## 2019-12-24 DIAGNOSIS — M2550 Pain in unspecified joint: Secondary | ICD-10-CM | POA: Diagnosis not present

## 2019-12-27 DIAGNOSIS — D225 Melanocytic nevi of trunk: Secondary | ICD-10-CM | POA: Diagnosis not present

## 2019-12-27 DIAGNOSIS — B351 Tinea unguium: Secondary | ICD-10-CM | POA: Diagnosis not present

## 2019-12-27 DIAGNOSIS — L920 Granuloma annulare: Secondary | ICD-10-CM | POA: Diagnosis not present

## 2019-12-27 DIAGNOSIS — Z85828 Personal history of other malignant neoplasm of skin: Secondary | ICD-10-CM | POA: Diagnosis not present

## 2019-12-27 DIAGNOSIS — L570 Actinic keratosis: Secondary | ICD-10-CM | POA: Diagnosis not present

## 2019-12-27 DIAGNOSIS — L814 Other melanin hyperpigmentation: Secondary | ICD-10-CM | POA: Diagnosis not present

## 2020-01-07 ENCOUNTER — Other Ambulatory Visit: Payer: Self-pay | Admitting: Obstetrics and Gynecology

## 2020-01-07 ENCOUNTER — Ambulatory Visit (INDEPENDENT_AMBULATORY_CARE_PROVIDER_SITE_OTHER): Payer: BC Managed Care – PPO | Admitting: Obstetrics and Gynecology

## 2020-01-07 ENCOUNTER — Encounter: Payer: Self-pay | Admitting: Obstetrics and Gynecology

## 2020-01-07 ENCOUNTER — Ambulatory Visit
Admission: RE | Admit: 2020-01-07 | Discharge: 2020-01-07 | Disposition: A | Discharge status: Home / Self Care | Payer: BC Managed Care – PPO | Source: Ambulatory Visit | Attending: Obstetrics and Gynecology | Admitting: Obstetrics and Gynecology

## 2020-01-07 ENCOUNTER — Other Ambulatory Visit: Payer: Self-pay

## 2020-01-07 VITALS — BP 110/60 | Ht 60.0 in | Wt 155.0 lb

## 2020-01-07 DIAGNOSIS — Z01419 Encounter for gynecological examination (general) (routine) without abnormal findings: Secondary | ICD-10-CM | POA: Diagnosis not present

## 2020-01-07 DIAGNOSIS — Z1231 Encounter for screening mammogram for malignant neoplasm of breast: Secondary | ICD-10-CM | POA: Insufficient documentation

## 2020-01-07 DIAGNOSIS — N926 Irregular menstruation, unspecified: Secondary | ICD-10-CM

## 2020-01-07 DIAGNOSIS — G43839 Menstrual migraine, intractable, without status migrainosus: Secondary | ICD-10-CM

## 2020-01-07 MED ORDER — TRAMADOL HCL 50 MG PO TABS: 50.0000 mg | ORAL_TABLET | Freq: Four times a day (QID) | ORAL | 0 refills | Status: DC | PRN

## 2020-01-07 NOTE — Telephone Encounter (Signed)
Please advise 

## 2020-01-07 NOTE — Patient Instructions (Signed)
I value your feedback and entrusting us with your care. If you get a [facility] patient survey, I would appreciate you taking the time to let us know about your experience today. Thank you!  As of December 28, 2018, your lab results will be released to your MyChart immediately, before I even have a chance to see them. Please give me time to review them and contact you if there are any abnormalities. Thank you for your patience.  

## 2020-01-07 NOTE — Progress Notes (Addendum)
PCP:  Lesleigh Noe, MD   Chief Complaint  Patient presents with  . Gynecologic Exam    Continued irregular cycles     HPI:      [name] is a 49 y.o. G0P0000 who LMP was Patient's last menstrual period was 12/30/2019 (approximate)., presents today for her annual examination.  Her menses are every 1-2 months, lasting 4-5 days, light to mod flow.  Sometimes has a few days of spotting only for a period. Dysmenorrhea mild, occurring first 1-2 days of flow. She has infrequent  light spotting mid cycle. Had 1 episode AUB 2/21 that resolved. Has menstrual migraines, takes tramadol sparingly with some relief. Having occas vasomotor sx.   Sex activity: rarely sex active with single partner, contraception - condoms, declines other BC. Has decreased libido.  Last Pap: September 23, 2016  Results were: no abnormalities /neg HPV DNA  Hx of STDs: none  Last mammogram: 01/01/19  Results were: normal--routine follow-up in 12 months. Has mammo sched today. There is no FH of breast cancer. There is no FH of ovarian cancer. The patient does not do self-breast exams.  Tobacco use: The patient denies current or previous tobacco use. Alcohol use: none No drug use.  Exercise: moderately active  She does get adequate calcium and Vitamin D in her diet. Labs with PCP 9/21. Strong FH CAD on both sides.  Pt with hx of ulcerative colitis. Seeing GI. Colonoscopy earlier this yr.    Past Medical History:  Diagnosis Date  . Anemia   . Arthritis   . Blood transfusion without reported diagnosis unknown   for tx uc  . Colon polyps 1997 or 1998?  [facility] GI bleeding   . Ulcerative colitis [facility])     Past Surgical History:  Procedure Laterality Date  . BASAL CELL CARCINOMA EXCISION  2020   left breast  . COLONOSCOPY  1997 or 1998?  [facility] HEMORRHOIDECTOMY WITH HEMORRHOID BANDING      Family History  Problem Relation Age of Onset  . Diabetes Father   . Heart disease Father   . Hypertension  Father   . Hyperlipidemia Father   . Heart attack Father 52  . Other Father        heart bypass and heart transplant  . Diabetes Mother   . Hypertension Mother   . Dementia Mother        senile  . Heart attack Mother 78  . Diabetes Brother   . Heart disease Brother   . Heart failure Brother   . Heart attack Brother 78  . Heart disease Maternal Grandmother   . Heart attack Maternal Grandmother   . Other Paternal Grandmother        carotid blockage  . Stroke Paternal Grandmother   . Colon cancer Neg Hx   . Colon polyps Neg Hx   . Esophageal cancer Neg Hx   . Kidney disease Neg Hx   . Gallbladder disease Neg Hx     Social History   Socioeconomic History  . Marital status: Single    Spouse name: Not on file  . Number of children: 1  . Years of education: High school  . Highest education level: Not on file  Occupational History  . Occupation: Sales  Tobacco Use  . Smoking status: Never Smoker  . Smokeless tobacco: Never Used  Vaping Use  . Vaping Use: Never used  Substance and Sexual Activity  . Alcohol use: Yes  Alcohol/week: 0.0 standard drinks    Comment: Rarely-about 2 times a year if that  . Drug use: No  . Sexual activity: Not Currently    Birth control/protection: Condom  Other Topics Concern  . Not on file  Social History Narrative   11/16/18   From: the area   Living: son tyler and boyfriend Dominica Severin (15 yos)   Work: in Press photographer      Family: Has son Dorothea Ogle (73 yo), boyfriend      Enjoys: cooking for other people, walking/hiking      Exercise: walking and weight training   Diet: small meals, only once per day      Safety   Seat belts: Yes    Guns: Yes  and secure   Safe in relationships: Yes    Social Determinants of Health   Financial Resource Strain: Not on file  Food Insecurity: Not on file  Transportation Needs: Not on file  Physical Activity: Not on file  Stress: Not on file  Social Connections: Not on file  Intimate Partner Violence: Not  on file    Outpatient Medications Prior to Visit  Medication Sig Dispense Refill  . augmented betamethasone dipropionate (DIPROLENE-AF) 0.05 % cream APPLY ON RASH TWICE DAILY AS NEEDED FOR FLARES    . BIOTIN PO Take 1 tablet by mouth daily.    . budesonide (ENTOCORT EC) 3 MG 24 hr capsule TAKE 3 CAPSULES BY MOUTH ONCE DAILY 90 capsule 0  . cholecalciferol (VITAMIN D3) 25 MCG (1000 UNIT) tablet Take 1,000 Units by mouth daily.    . ciclopirox (PENLAC) 8 % solution Apply topically.    . Flaxseed, Linseed, (FLAXSEED OIL PO) Take 1 tablet by mouth as needed.    . mesalamine (LIALDA) 1.2 g EC tablet Take 4 tablets (4.8 g total) by mouth daily with breakfast. 120 tablet 3  . mesalamine (ROWASA) 4 g enema PLACE 60 MLS RECTALLY TWICE DAILY 94496 mL 0  . OVER THE COUNTER MEDICATION Take 1 tablet by mouth as needed. Pt taking Tumeric    . pantoprazole (PROTONIX) 40 MG tablet Take 1 tablet by mouth once daily 60 tablet 3  . Probiotic Product (PROBIOTIC DAILY PO) Take 1 tablet by mouth as needed.    . traMADol (ULTRAM) 50 MG tablet Take 1 tablet (50 mg total) by mouth every 6 (six) hours as needed. 30 tablet 0   Facility-Administered Medications Prior to Visit  Medication Dose Route Frequency Provider Last Rate Last Admin  . 0.9 %  sodium chloride infusion  500 mL Intravenous Once Thornton Park, MD          ROS:  Review of Systems  Constitutional: Negative for fatigue, fever and unexpected weight change.  Respiratory: Negative for cough, shortness of breath and wheezing.   Cardiovascular: Negative for chest pain, palpitations and leg swelling.  Gastrointestinal: Negative for blood in stool, constipation, diarrhea, nausea and vomiting.  Endocrine: Negative for cold intolerance, heat intolerance and polyuria.  Genitourinary: Negative for dyspareunia, dysuria, flank pain, frequency, genital sores, hematuria, menstrual problem, pelvic pain, urgency, vaginal bleeding, vaginal discharge and  vaginal pain.  Musculoskeletal: Negative for arthralgias, back pain, joint swelling and myalgias.  Skin: Negative for rash.  Neurological: Positive for headaches. Negative for dizziness, syncope, light-headedness and numbness.  Hematological: Negative for adenopathy.  Psychiatric/Behavioral: Negative for agitation, confusion, sleep disturbance and suicidal ideas. The patient is not nervous/anxious.   BREAST: No symptoms   Objective: BP 110/60   Ht 5' (1.524 m)  Wt 155 lb (70.3 kg)   LMP 12/30/2019 (Approximate)   BMI 30.27 kg/m    Physical Exam Constitutional:      Appearance: She is well-developed.  Genitourinary:     Vulva normal.     Right Labia: No rash, tenderness or lesions.    Left Labia: No tenderness, lesions or rash.    No vaginal discharge, erythema or tenderness.      Right Adnexa: not tender and no mass present.    Left Adnexa: not tender and no mass present.    No cervical friability or polyp.     Uterus is not enlarged or tender.  Breasts:     Right: No mass, nipple discharge, skin change or tenderness.     Left: No mass, nipple discharge, skin change or tenderness.    Neck:     Thyroid: No thyromegaly.  Cardiovascular:     Rate and Rhythm: Normal rate and regular rhythm.     Heart sounds: Normal heart sounds. No murmur heard.   Pulmonary:     Effort: Pulmonary effort is normal.     Breath sounds: Normal breath sounds.  Abdominal:     Palpations: Abdomen is soft.     Tenderness: There is no abdominal tenderness. There is no guarding or rebound.  Musculoskeletal:        General: Normal range of motion.     Cervical back: Normal range of motion.  Lymphadenopathy:     Cervical: No cervical adenopathy.  Neurological:     General: No focal deficit present.     Mental Status: She is alert and oriented to person, place, and time.     Cranial Nerves: No cranial nerve deficit.  Skin:    General: Skin is warm and dry.  Psychiatric:        Mood and  Affect: Mood normal.        Behavior: Behavior normal.        Thought Content: Thought content normal.        Judgment: Judgment normal.  Vitals reviewed.     Assessment/Plan: Encounter for annual routine gynecological examination  Irregular menses--Q1-2 months; 1 episode AUB 2/21 but no sx since. Will eval any further AUB with u/s. F/u prn.  Encounter for screening mammogram for malignant neoplasm of breast--pt has mammo today.      Menstrual migraine--Rx RF tramadol to take prn.      GYN counsel breast self exam, mammography screening, adequate intake of calcium and vitamin D, diet and exercise     F/U  Return in about 1 year (around 01/06/2021).  Nayden Czajka B. Makiyla Linch, PA-C 01/07/2020 3:09 PM

## 2020-01-10 ENCOUNTER — Ambulatory Visit: Payer: BC Managed Care – PPO | Admitting: Gastroenterology

## 2020-01-15 DIAGNOSIS — C44719 Basal cell carcinoma of skin of left lower limb, including hip: Secondary | ICD-10-CM | POA: Diagnosis not present

## 2020-01-28 DIAGNOSIS — M2550 Pain in unspecified joint: Secondary | ICD-10-CM | POA: Diagnosis not present

## 2020-01-28 DIAGNOSIS — M791 Myalgia, unspecified site: Secondary | ICD-10-CM | POA: Diagnosis not present

## 2020-01-28 DIAGNOSIS — K5190 Ulcerative colitis, unspecified, without complications: Secondary | ICD-10-CM | POA: Diagnosis not present

## 2020-01-28 DIAGNOSIS — R5383 Other fatigue: Secondary | ICD-10-CM | POA: Diagnosis not present

## 2020-03-19 ENCOUNTER — Telehealth: Payer: Self-pay

## 2020-03-19 NOTE — Telephone Encounter (Signed)
Agree ok for in-person visit. Though also agree with recommendation that she may benefit from earlier appointment and ER precautions if symptoms recur

## 2020-03-19 NOTE — Telephone Encounter (Signed)
Received a MyChart message regarding patient making an appointment for Monday 3/7 (please refer to MyChart message). Stated that yesterday (3/1) patient was experiencing some nasal congestion and what she believes to be a sinus headache. Patient reported that her vision was blurry (almost like a kaleidoscope), trouble getting her words out, and feeling tired. Patient denied SOB, chest pain, dizziness, N/V, weakness, slurred speech, facial drooping or confusion. Patient also denied symptoms related to covid such as fever, cough, sore throat, diarrhea, loss of taste or smell. Patient stated that she is not having symptoms today. Informed patient to keep appointment on 3/7, but if symptoms become worse she should report to UC or ED. Patient verbalized understanding. Offered sooner appointment with another provider, but patient preferred to see Dr. Einar Pheasant. Ok to keep in office appointment? Please advise.

## 2020-03-24 ENCOUNTER — Ambulatory Visit (INDEPENDENT_AMBULATORY_CARE_PROVIDER_SITE_OTHER): Payer: BC Managed Care – PPO | Admitting: Family Medicine

## 2020-03-24 ENCOUNTER — Other Ambulatory Visit: Payer: Self-pay

## 2020-03-24 ENCOUNTER — Encounter: Payer: Self-pay | Admitting: Family Medicine

## 2020-03-24 VITALS — BP 100/60 | HR 83 | Temp 98.1°F | Ht 60.0 in | Wt 158.5 lb

## 2020-03-24 DIAGNOSIS — H539 Unspecified visual disturbance: Secondary | ICD-10-CM | POA: Insufficient documentation

## 2020-03-24 DIAGNOSIS — R519 Headache, unspecified: Secondary | ICD-10-CM | POA: Diagnosis not present

## 2020-03-24 DIAGNOSIS — R4701 Aphasia: Secondary | ICD-10-CM | POA: Diagnosis not present

## 2020-03-24 NOTE — Assessment & Plan Note (Signed)
Etiology unclear, however, concerning for possible TIA vs atypical migraine given HA following incident. Discussed getting MRI brain to further evaluate. ER if symptoms recur.

## 2020-03-24 NOTE — Assessment & Plan Note (Signed)
?  aura prior to migraine - though inconsistent with her current HA pattern. Brain MRI and also advised eye doctor evaluation.

## 2020-03-24 NOTE — Assessment & Plan Note (Signed)
Advised saline rinse, allergy medication, flonase and limiting tylenol to severe symptoms for chronic headache pattern

## 2020-03-24 NOTE — Patient Instructions (Signed)
If you get these symptoms again - ER  Otherwise, get MRI to evaluate further

## 2020-03-24 NOTE — Progress Notes (Signed)
Subjective:     Ebony [name] is a 50 y.o. female presenting for Blurred Vision (Only on 03/18/20 "Vision was like a kaleidescope" for about 5 mins, with a headache and neck pain ) and Aphasia (Husband states that pt wasn't making any sense with her words, following blurred vision and headache )     HPI  #Episode - occurred on 03/18/2020 - blurry vision - Kaleidescope - lasted for 30 minutes, blurry with outer color changes - would close eyes and keep one eye open - looked in the mirror and didn't see anything - eventually this improved - headache following this episode - neck and traveling to the left side of the top of head  - left the house and 10-15 minutes later was speaking with husband and was trying to say words but it was coming out jumbled  - like she was making up her own words and having difficulty finding the right words - that lasted for about 5 minutes  - was trying to think about what she wanted to say - felt very tired after this event - took tylenol prior to the word finding episode - continued to have a HA - top of head and temples pulsing - improved with pressure - went to sleep and had another HA later that night - woke up feeling fine the next day  Hx of migraines - menstrual - no hx of aura with prior migraines Gets a lot of sinus HA - maxillary location     Review of Systems   Social History   Tobacco Use  Smoking Status Never Smoker  Smokeless Tobacco Never Used        Objective:    BP Readings from Last 3 Encounters:  03/24/20 100/60  01/07/20 110/60  12/19/19 138/65   Wt Readings from Last 3 Encounters:  03/24/20 158 lb 8 oz (71.9 kg)  01/07/20 155 lb (70.3 kg)  12/19/19 152 lb (68.9 kg)    BP 100/60   Pulse 83   Temp 98.1 F (36.7 C) (Temporal)   Ht 5' (1.524 m)   Wt 158 lb 8 oz (71.9 kg)   SpO2 98%   BMI 30.95 kg/m    Physical Exam Constitutional:      General: She is not in acute distress.    Appearance: She is  well-developed. She is not diaphoretic.  HENT:     Head: Normocephalic and atraumatic.     Right Ear: Tympanic membrane and external ear normal.     Left Ear: Tympanic membrane and external ear normal.     Nose: Nose normal.     Mouth/Throat:     Mouth: Mucous membranes are moist.     Pharynx: No posterior oropharyngeal erythema.  Eyes:     Extraocular Movements: Extraocular movements intact.     Conjunctiva/sclera: Conjunctivae normal.     Pupils: Pupils are equal, round, and reactive to light.  Cardiovascular:     Rate and Rhythm: Normal rate and regular rhythm.     Heart sounds: No murmur heard.   Pulmonary:     Effort: Pulmonary effort is normal. No respiratory distress.     Breath sounds: Normal breath sounds. No wheezing.  Musculoskeletal:     Cervical back: Neck supple.  Skin:    General: Skin is warm and dry.     Capillary Refill: Capillary refill takes less than 2 seconds.  Neurological:     Mental Status: She is alert. Mental status is  at baseline.     Cranial Nerves: No cranial nerve deficit.     Sensory: No sensory deficit.     Motor: No weakness.     Coordination: Coordination normal.     Gait: Gait normal.     Deep Tendon Reflexes: Reflexes normal.  Psychiatric:        Mood and Affect: Mood normal.        Behavior: Behavior normal.           Assessment & Plan:   Problem List Items Addressed This Visit      Other   Aphasia - Primary    Etiology unclear, however, concerning for possible TIA vs atypical migraine given HA following incident. Discussed getting MRI brain to further evaluate. ER if symptoms recur.       Relevant Orders   MR Brain Wo Contrast   Sinus headache    Advised saline rinse, allergy medication, flonase and limiting tylenol to severe symptoms for chronic headache pattern      Vision changes    ?aura prior to migraine - though inconsistent with her current HA pattern. Brain MRI and also advised eye doctor evaluation.            Return if symptoms worsen or fail to improve.  Lesleigh Noe, MD  This visit occurred during the SARS-CoV-2 public health emergency.  Safety protocols were in place, including screening questions prior to the visit, additional usage of staff PPE, and extensive cleaning of exam room while observing appropriate contact time as indicated for disinfecting solutions.

## 2020-04-03 NOTE — Addendum Note (Signed)
Addended by: Kris Mouton on: 04/03/2020 04:27 PM   Modules accepted: Orders

## 2020-04-08 ENCOUNTER — Ambulatory Visit: Payer: BC Managed Care – PPO

## 2020-04-17 ENCOUNTER — Other Ambulatory Visit: Payer: BC Managed Care – PPO

## 2020-04-21 ENCOUNTER — Ambulatory Visit
Admission: RE | Admit: 2020-04-21 | Discharge: 2020-04-21 | Disposition: A | Discharge status: Home / Self Care | Payer: BC Managed Care – PPO | Source: Ambulatory Visit | Attending: Family Medicine | Admitting: Family Medicine

## 2020-04-21 ENCOUNTER — Other Ambulatory Visit: Payer: Self-pay

## 2020-04-21 DIAGNOSIS — J0130 Acute sphenoidal sinusitis, unspecified: Secondary | ICD-10-CM | POA: Diagnosis not present

## 2020-04-21 DIAGNOSIS — R479 Unspecified speech disturbances: Secondary | ICD-10-CM | POA: Diagnosis not present

## 2020-04-21 DIAGNOSIS — H538 Other visual disturbances: Secondary | ICD-10-CM | POA: Diagnosis not present

## 2020-04-21 DIAGNOSIS — R5383 Other fatigue: Secondary | ICD-10-CM | POA: Diagnosis not present

## 2020-04-21 DIAGNOSIS — R4701 Aphasia: Secondary | ICD-10-CM

## 2020-04-22 ENCOUNTER — Encounter: Payer: Self-pay | Admitting: Family Medicine

## 2020-08-18 ENCOUNTER — Ambulatory Visit: Payer: BC Managed Care – PPO | Admitting: Family Medicine

## 2020-08-18 ENCOUNTER — Telehealth: Payer: Self-pay

## 2020-08-18 NOTE — Telephone Encounter (Signed)
[facility] - Client Nonclinical Telephone Record  [facility] Client [facility] - Client Client Site [facility] Physician Waunita Schooner- MD Contact Type Call Who Is Calling Patient / Member / Family / Caregiver Caller Name Vieno Tarrant Caller Phone Number [phone] Patient Name Ebony [name] Patient DOB [birth year] Call Type Message Only Information Provided Reason for Call Request to [facility] Appointment Initial Comment Caller states she wanted to cancel her appt on Monday at 1020 a.m. Patient request to speak to RN No Additional Comment Caller was provided office hours and she says she is feeling like she is improving. Disp. Time Disposition Final User 08/17/2020 10:18:31 AM General Information Provided Yes Josephine Cables Call Closed By: Josephine Cables Transaction Date/Time: 08/17/2020 10:15:46 AM (ET)

## 2020-11-20 ENCOUNTER — Encounter: Payer: Self-pay | Admitting: Family Medicine

## 2020-11-20 ENCOUNTER — Ambulatory Visit (INDEPENDENT_AMBULATORY_CARE_PROVIDER_SITE_OTHER): Payer: BC Managed Care – PPO | Admitting: Family Medicine

## 2020-11-20 ENCOUNTER — Other Ambulatory Visit: Payer: Self-pay

## 2020-11-20 VITALS — BP 122/98 | HR 74 | Temp 97.0°F | Ht 59.75 in | Wt 146.2 lb

## 2020-11-20 DIAGNOSIS — R7303 Prediabetes: Secondary | ICD-10-CM | POA: Diagnosis not present

## 2020-11-20 DIAGNOSIS — Z23 Encounter for immunization: Secondary | ICD-10-CM

## 2020-11-20 DIAGNOSIS — E782 Mixed hyperlipidemia: Secondary | ICD-10-CM | POA: Diagnosis not present

## 2020-11-20 DIAGNOSIS — K5180 Other ulcerative colitis without complications: Secondary | ICD-10-CM | POA: Diagnosis not present

## 2020-11-20 DIAGNOSIS — Z0000 Encounter for general adult medical examination without abnormal findings: Secondary | ICD-10-CM | POA: Diagnosis not present

## 2020-11-20 LAB — TSH: TSH: 5.35 u[IU]/mL (ref 0.35–5.50)

## 2020-11-20 LAB — LIPID PANEL
Cholesterol: 210 mg/dL — ABNORMAL HIGH (ref 0–200)
HDL: 69.1 mg/dL (ref >39.00)
LDL Cholesterol: 123 mg/dL — ABNORMAL HIGH (ref 0–99)
NonHDL: 140.71
Total CHOL/HDL Ratio: 3
Triglycerides: 88 mg/dL (ref 0.0–149.0)
VLDL: 17.6 mg/dL (ref 0.0–40.0)

## 2020-11-20 LAB — CBC
HCT: 41.6 % (ref 36.0–46.0)
Hemoglobin: 13.7 g/dL (ref 12.0–15.0)
MCHC: 32.9 g/dL (ref 30.0–36.0)
MCV: 93 fl (ref 78.0–100.0)
Platelets: 276 10*3/uL (ref 150.0–400.0)
RBC: 4.47 Mil/uL (ref 3.87–5.11)
RDW: 13.6 % (ref 11.5–15.5)
WBC: 3.7 10*3/uL — ABNORMAL LOW (ref 4.0–10.5)

## 2020-11-20 LAB — COMPREHENSIVE METABOLIC PANEL
ALT: 23 U/L (ref 0–35)
AST: 26 U/L (ref 0–37)
Albumin: 4.2 g/dL (ref 3.5–5.2)
Alkaline Phosphatase: 55 U/L (ref 39–117)
BUN: 7 mg/dL (ref 6–23)
CO2: 29 mEq/L (ref 19–32)
Calcium: 9.3 mg/dL (ref 8.4–10.5)
Chloride: 103 mEq/L (ref 96–112)
Creatinine, Ser: 0.53 mg/dL (ref 0.40–1.20)
GFR: 109.67 mL/min (ref >60.00)
Glucose, Bld: 80 mg/dL (ref 70–99)
Potassium: 4.3 mEq/L (ref 3.5–5.1)
Sodium: 139 mEq/L (ref 135–145)
Total Bilirubin: 0.4 mg/dL (ref 0.2–1.2)
Total Protein: 6.7 g/dL (ref 6.0–8.3)

## 2020-11-20 LAB — HEMOGLOBIN A1C: Hgb A1c MFr Bld: 5.4 % (ref 4.6–6.5)

## 2020-11-20 NOTE — Progress Notes (Signed)
Annual Exam   Chief Complaint:  Chief Complaint  Patient presents with   Annual Exam    No concerns     History of Present Illness:  [name] is a 50 y.o. G0P0000 who LMP was No LMP recorded. (Menstrual status: Irregular Periods)., presents today for her annual examination.     Nutrition Diet: working on this, doing well - avoiding gluten - vegetarian diet Exercise: walking and staying active She does get adequate calcium and Vitamin D in her diet.   Social History   Tobacco Use  Smoking Status Never  Smokeless Tobacco Never   Social History   Substance and Sexual Activity  Alcohol Use Yes   Alcohol/week: 0.0 standard drinks   Comment: Rarely-about 2 times a year if that   Social History   Substance and Sexual Activity  Drug Use No    Safety The patient wears seatbelts: yes.     The patient feels safe at home and in their relationships: yes.  General Health Dentist in the last year: Yes Eye doctor: no  Menstrual Her menses are irregular - 3 in the last year Menopause: hot flashes, mood swings  GYN She is not sexually active and single partner, contraception - none.    Cervical Cancer Screening:   Last Pap:   September 2018 Results were: no abnormalities /neg HPV DNA    Breast Cancer Screening There is no FH of breast cancer. There is no FH of ovarian cancer. BRCA screening No.  Discussed that for average risk women between age 29-49 screening may reduce the risk of breast cancer death, however, at a lower rate than those over age 57. And that the the false-positive rates resulting in unnecessary biopsies with more screening is higher. The balance of benefits vs harms likely improves as you progress through your 40s. The patient does want a mammogram this year.   Colon Cancer Screening:  Age 69-75 yo - benefits outweigh the risk. Adults 79-85 yo who have never been screened benefit.  Benefits: 134000 people in 2016 will be diagnosed and  49,000 will die - early detection helps Harms: Complications 2/2 to colonoscopy High Risk (Colonoscopy): genetic disorder (Lynch syndrome or familial adenomatous polyposis), personal hx of IBD, previous adenomatous polyp, or previous colorectal cancer, FamHx start 10 years before the age at diagnosis, increased in males and black race  Options:  FIT - looks for hemoglobin (blood in the stool) - specific and fairly sensitive - must be done annually Cologuard - looks for DNA and blood - more sensitive - therefore can have more false positives, every 3 years Colonoscopy - every 10 years if normal - sedation, bowl prep, must have someone drive you  Shared decision making and the patient had decided to do colonscopy - last done 2020 - anticipate 5 years due to UC.  Weight Wt Readings from Last 3 Encounters:  11/20/20 146 lb 4 oz (66.3 kg)  03/24/20 158 lb 8 oz (71.9 kg)  01/07/20 155 lb (70.3 kg)   Patient has high BMI  BMI Readings from Last 1 Encounters:  11/20/20 28.80 kg/m     Chronic disease screening Blood pressure monitoring:  BP Readings from Last 3 Encounters:  11/20/20 (!) 130/98  03/24/20 100/60  01/07/20 110/60    Lipid Monitoring: Indication for screening: age >39, obesity, diabetes, family hx, CV risk factors.  Lipid screening: Yes  Lab Results  Component Value Date   CHOL 190 10/08/2019   HDL 66.10  10/08/2019   LDLCALC 101 (H) 10/08/2019   TRIG 119.0 10/08/2019   CHOLHDL 3 10/08/2019     Diabetes Screening: age >68, overweight, family hx, PCOS, hx of gestational diabetes, at risk ethnicity Diabetes Screening screening: Yes  Lab Results  Component Value Date   HGBA1C 5.2 11/19/2019     Past Medical History:  Diagnosis Date   Anemia    Arthritis    Blood transfusion without reported diagnosis unknown   for tx uc   Colon polyps 1997 or 1998?   GI bleeding    Ulcerative colitis [facility])     Past Surgical History:  Procedure Laterality Date   BASAL  CELL CARCINOMA EXCISION  2020   left breast   COLONOSCOPY  1997 or 1998?   HEMORRHOIDECTOMY WITH HEMORRHOID BANDING      Prior to Admission medications   Medication Sig Start Date End Date Taking? Authorizing Provider  augmented betamethasone dipropionate (DIPROLENE-AF) 0.05 % cream APPLY ON RASH TWICE DAILY AS NEEDED FOR FLARES 11/01/18  Yes [provider]  BIOTIN PO Take 1 tablet by mouth daily.   Yes [provider]  budesonide (ENTOCORT EC) 3 MG 24 hr capsule TAKE 3 CAPSULES BY MOUTH ONCE DAILY 10/30/19  Yes Thornton Park, MD  cholecalciferol (VITAMIN D3) 25 MCG (1000 UNIT) tablet Take 1,000 Units by mouth daily.   Yes [provider]  ciclopirox (PENLAC) 8 % solution Apply topically. 12/27/19  Yes [provider]  Flaxseed, Linseed, (FLAXSEED OIL PO) Take 1 tablet by mouth as needed.   Yes [provider]  mesalamine (LIALDA) 1.2 g EC tablet Take 4 tablets (4.8 g total) by mouth daily with breakfast. 08/08/19  Yes Thornton Park, MD  mesalamine (ROWASA) 4 g enema PLACE 60 MLS RECTALLY TWICE DAILY 10/30/19  Yes Thornton Park, MD  OVER THE COUNTER MEDICATION Take 1 tablet by mouth as needed. Pt taking Tumeric   Yes [provider]  pantoprazole (PROTONIX) 40 MG tablet Take 1 tablet by mouth once daily 11/30/18  Yes Beavers, Joelene Millin, MD  Probiotic Product (PROBIOTIC DAILY PO) Take 1 tablet by mouth as needed.   Yes [provider]  traMADol (ULTRAM) 50 MG tablet Take 1 tablet (50 mg total) by mouth every 6 (six) hours as needed. 93/26/71  Yes Copland, Alicia B, PA-C    No Known Allergies  Gynecologic History: No LMP recorded. (Menstrual status: Irregular Periods).  Obstetric History: G0P0000  Social History   Socioeconomic History   Marital status: Single    Spouse name: Not on file   Number of children: 1   Years of education: High school   Highest education level: Not on file  Occupational History    Occupation: Sales  Tobacco Use   Smoking status: Never   Smokeless tobacco: Never  Vaping Use   Vaping Use: Never used  Substance and Sexual Activity   Alcohol use: Yes    Alcohol/week: 0.0 standard drinks    Comment: Rarely-about 2 times a year if that   Drug use: No   Sexual activity: Not Currently    Birth control/protection: Condom  Other Topics Concern   Not on file  Social History Narrative   11/16/18   From: the area   Living: son tyler and boyfriend Dominica Severin (15 yos)   Work: in Press photographer      Family: Has son Dorothea Ogle (61 yo), boyfriend      Enjoys: cooking for other people, walking/hiking      Exercise:  walking and weight training   Diet: small meals, only once per day      Safety   Seat belts: Yes    Guns: Yes  and secure   Safe in relationships: Yes    Social Determinants of Health   Financial Resource Strain: Not on file  Food Insecurity: Not on file  Transportation Needs: Not on file  Physical Activity: Not on file  Stress: Not on file  Social Connections: Not on file  Intimate Partner Violence: Not on file    Family History  Problem Relation Age of Onset   Diabetes Father    Heart disease Father    Hypertension Father    Hyperlipidemia Father    Heart attack Father 39   Other Father        heart bypass and heart transplant   Diabetes Mother    Hypertension Mother    Dementia Mother        senile   Heart attack Mother 29   Diabetes Brother    Heart disease Brother    Heart failure Brother    Heart attack Brother 85   Heart disease Maternal Grandmother    Heart attack Maternal Grandmother    Other Paternal Grandmother        carotid blockage   Stroke Paternal Grandmother    Colon cancer Neg Hx    Colon polyps Neg Hx    Esophageal cancer Neg Hx    Kidney disease Neg Hx    Gallbladder disease Neg Hx    Breast cancer Neg Hx     Review of Systems  Constitutional:  Negative for chills and fever.  HENT:  Negative for congestion and sore throat.    Eyes:  Negative for blurred vision and double vision.  Respiratory:  Negative for shortness of breath.   Cardiovascular:  Negative for chest pain.  Gastrointestinal:  Negative for heartburn, nausea and vomiting.  Genitourinary: Negative.   Musculoskeletal: Negative.  Negative for myalgias.  Skin:  Negative for rash.  Neurological:  Negative for dizziness and headaches.  Endo/Heme/Allergies:  Does not bruise/bleed easily.  Psychiatric/Behavioral:  Negative for depression. The patient is not nervous/anxious.     Physical Exam BP (!) 130/98   Pulse 74   Temp (!) 97 F (36.1 C) (Temporal)   Ht 4' 11.75" (1.518 m)   Wt 146 lb 4 oz (66.3 kg)   SpO2 99%   BMI 28.80 kg/m    BP Readings from Last 3 Encounters:  11/20/20 (!) 130/98  03/24/20 100/60  01/07/20 110/60      Physical Exam Constitutional:      General: She is not in acute distress.    Appearance: She is well-developed. She is not diaphoretic.  HENT:     Head: Normocephalic and atraumatic.     Right Ear: External ear normal.     Left Ear: External ear normal.     Nose: Nose normal.  Eyes:     General: No scleral icterus.    Extraocular Movements: Extraocular movements intact.     Conjunctiva/sclera: Conjunctivae normal.  Cardiovascular:     Rate and Rhythm: Normal rate and regular rhythm.     Heart sounds: No murmur heard. Pulmonary:     Effort: Pulmonary effort is normal. No respiratory distress.     Breath sounds: Normal breath sounds. No wheezing.  Abdominal:     General: Bowel sounds are normal. There is no distension.     Palpations: Abdomen is soft.  There is no mass.     Tenderness: There is no abdominal tenderness. There is no guarding or rebound.  Musculoskeletal:        General: Normal range of motion.     Cervical back: Neck supple.  Lymphadenopathy:     Cervical: No cervical adenopathy.  Skin:    General: Skin is warm and dry.     Capillary Refill: Capillary refill takes less than 2 seconds.   Neurological:     Mental Status: She is alert and oriented to person, place, and time.     Deep Tendon Reflexes: Reflexes normal.  Psychiatric:        Mood and Affect: Mood normal.        Behavior: Behavior normal.     Results:  PHQ-9:  [place] Office Visit from 11/16/2018 in [place] at [facility]  PHQ-9 Total Score 3         Assessment: 50 y.o. G0P0000 female here for routine annual physical examination.  Plan: Problem List Items Addressed This Visit       Digestive   Ulcerative colitis ([place])   Relevant Orders   Comprehensive metabolic panel   CBC     Other   Mixed hyperlipidemia   Relevant Orders   Comprehensive metabolic panel   Lipid panel   Prediabetes   Relevant Orders   Hemoglobin A1c   Other Visit Diagnoses     Annual physical exam    -  Primary   Relevant Orders   Comprehensive metabolic panel   TSH   Need for influenza vaccination       Relevant Orders   Flu Vaccine QUAD 72moIM (Fluarix, Fluzone & Alfiuria Quad PF)   Comprehensive metabolic panel       Screening: -- Blood pressure screen normal -- cholesterol screening: will obtain -- Weight screening: normal -- Diabetes Screening: will obtain -- Nutrition: Encouraged healthy diet  The 10-year ASCVD risk score (Arnett DK, et al., 2019) is: 0.8%   Values used to calculate the score:     Age: 1957years     Sex: Female     Is Non-Hispanic African American: No     Diabetic: No     Tobacco smoker: No     Systolic Blood Pressure: 1756mmHg     Is BP treated: No     HDL Cholesterol: 66.1 mg/dL     Total Cholesterol: 190 mg/dL  -- Statin therapy for Age 48-75with CVD risk >7.5%  Psych -- Depression screening (PHQ-9):  [place] PH[place] Total Score 3        Safety -- tobacco screening: not using -- alcohol screening:  low-risk usage. -- no evidence of domestic violence or intimate partner  violence.   Cancer Screening -- pap smear not collected per ASCCP guidelines -- family history of breast cancer screening: done. not at high risk. -- Mammogram - ordered -- Colon cancer (age 48+--  up to date  Immunizations Immunization History  Administered Date(s) Administered   Influenza,inj,Quad PF,6+ Mos 12/07/2017, 11/16/2018, 10/08/2019   PFIZER(Purple Top)SARS-COV-2 Vaccination 08/22/2019   Pneumococcal Polysaccharide-23 01/03/2019   Tdap 04/14/2006, 11/16/2018    -- flu vaccine up to date -- TDAP q10 years up to date -- PPSV-23 (19-64 with chronic disease or smoking) up to date -- Covid-19 Vaccine up to date   Encouraged healthy diet and exercise. Encouraged regular vision and dental care.  Lesleigh Noe, MD

## 2020-11-20 NOTE — Patient Instructions (Signed)
Continue regular exercise and healthy diet   Sinus Congestion 1) Neti Pot (Saline rinse) -- 2 times day -- if tolerated 2) Flonase (Store Brand ok) - once daily 3) Over the counter congestion medications 4) Allergy medication - zyrtec, claritin, allergra - xyzal - store brand fine

## 2020-11-21 ENCOUNTER — Encounter: Payer: Self-pay | Admitting: Family Medicine

## 2020-11-27 ENCOUNTER — Encounter: Payer: Self-pay | Admitting: Family Medicine

## 2020-12-08 ENCOUNTER — Other Ambulatory Visit: Payer: Self-pay | Admitting: Obstetrics and Gynecology

## 2020-12-08 ENCOUNTER — Other Ambulatory Visit: Payer: Self-pay | Admitting: Family Medicine

## 2020-12-08 DIAGNOSIS — Z1231 Encounter for screening mammogram for malignant neoplasm of breast: Secondary | ICD-10-CM

## 2020-12-17 ENCOUNTER — Ambulatory Visit: Payer: BC Managed Care – PPO | Admitting: Gastroenterology

## 2020-12-29 DIAGNOSIS — L821 Other seborrheic keratosis: Secondary | ICD-10-CM | POA: Diagnosis not present

## 2020-12-29 DIAGNOSIS — C44519 Basal cell carcinoma of skin of other part of trunk: Secondary | ICD-10-CM | POA: Diagnosis not present

## 2020-12-29 DIAGNOSIS — D0471 Carcinoma in situ of skin of right lower limb, including hip: Secondary | ICD-10-CM | POA: Diagnosis not present

## 2020-12-29 DIAGNOSIS — Z85828 Personal history of other malignant neoplasm of skin: Secondary | ICD-10-CM | POA: Diagnosis not present

## 2020-12-29 DIAGNOSIS — L814 Other melanin hyperpigmentation: Secondary | ICD-10-CM | POA: Diagnosis not present

## 2020-12-29 DIAGNOSIS — D225 Melanocytic nevi of trunk: Secondary | ICD-10-CM | POA: Diagnosis not present

## 2021-01-06 NOTE — Progress Notes (Signed)
PCP:  Lesleigh Noe, MD   Chief Complaint  Patient presents with   Gynecologic Exam    No concerns     HPI:      [name] is a 50 y.o. G0P0000 who LMP was Patient's last menstrual period was 10/04/2020 (approximate)., presents today for her annual examination.  Her menses are infrequent due to perimenopause (has had ~3 this yr), light to mod flow for 3 days, no AUB, mild dysmen. Had 1 episode AUB 2/21 that resolved. Having occas vasomotor sx and now mood changes/emotional lability with sleep disturbance. Hasn't tried anything for sleep, exercises regularly.    Sex activity: rarely sex active with single partner, no pain/bleeding. Last Pap: September 23, 2016  Results were: no abnormalities /neg HPV DNA  Hx of STDs: none  Last mammogram: 01/07/20  Results were: normal--routine follow-up in 12 months. Has mammo sched today. There is no FH of breast cancer. There is no FH of ovarian cancer. The patient does self-breast exams.  Tobacco use: The patient denies current or previous tobacco use. Alcohol use: none No drug use.  Exercise: very active  She does get adequate calcium and Vitamin D in her diet. Labs with PCP. Strong FH CAD on both sides.  Pt with hx of ulcerative colitis. EGD and Colonoscopy 2021, unsure when due next. Seeing Dr. Tarri Glenn at [facility]   Past Medical History:  Diagnosis Date   Anemia    Arthritis    Blood transfusion without reported diagnosis unknown   for tx uc   Colon polyps 1997 or 1998?   GI bleeding    Ulcerative colitis [facility])     Past Surgical History:  Procedure Laterality Date   BASAL CELL CARCINOMA EXCISION  2020   left breast   COLONOSCOPY  1997 or 1998?   HEMORRHOIDECTOMY WITH HEMORRHOID BANDING      Family History  Problem Relation Age of Onset   Diabetes Father    Heart disease Father    Hypertension Father    Hyperlipidemia Father    Heart attack Father 11   Other Father        heart bypass and heart  transplant   Diabetes Mother    Hypertension Mother    Dementia Mother        senile   Heart attack Mother 79   Diabetes Brother    Heart disease Brother    Heart failure Brother    Heart attack Brother 87   Heart disease Maternal Grandmother    Heart attack Maternal Grandmother    Other Paternal Grandmother        carotid blockage   Stroke Paternal Grandmother    Colon cancer Neg Hx    Colon polyps Neg Hx    Esophageal cancer Neg Hx    Kidney disease Neg Hx    Gallbladder disease Neg Hx    Breast cancer Neg Hx     Social History   Socioeconomic History   Marital status: Single    Spouse name: Not on file   Number of children: 1   Years of education: High school   Highest education level: Not on file  Occupational History   Occupation: Sales  Tobacco Use   Smoking status: Never   Smokeless tobacco: Never  Vaping Use   Vaping Use: Never used  Substance and Sexual Activity   Alcohol use: Yes    Alcohol/week: 0.0 standard drinks    Comment: Rarely-about 2 times  a year if that   Drug use: No   Sexual activity: Not Currently    Birth control/protection: Condom  Other Topics Concern   Not on file  Social History Narrative   11/16/18   From: the area   Living: son Ebony [name] and boyfriend Ebony [name] (15 yos)   Work: in Press photographer      Family: Has son Ebony [name] (77 yo), boyfriend      Enjoys: cooking for other people, walking/hiking      Exercise: walking and weight training   Diet: small meals, only once per day      Safety   Seat belts: Yes    Guns: Yes  and secure   Safe in relationships: Yes    Social Determinants of Health   Financial Resource Strain: Not on file  Food Insecurity: Not on file  Transportation Needs: Not on file  Physical Activity: Not on file  Stress: Not on file  Social Connections: Not on file  Intimate Partner Violence: Not on file    Outpatient Medications Prior to Visit  Medication Sig Dispense Refill   augmented betamethasone dipropionate  (DIPROLENE-AF) 0.05 % cream APPLY ON RASH TWICE DAILY AS NEEDED FOR FLARES     BIOTIN PO Take 1 tablet by mouth daily.     budesonide (ENTOCORT EC) 3 MG 24 hr capsule TAKE 3 CAPSULES BY MOUTH ONCE DAILY 90 capsule 0   cholecalciferol (VITAMIN D3) 25 MCG (1000 UNIT) tablet Take 1,000 Units by mouth daily.     Flaxseed, Linseed, (FLAXSEED OIL PO) Take 1 tablet by mouth as needed.     mesalamine (LIALDA) 1.2 g EC tablet Take 4 tablets (4.8 g total) by mouth daily with breakfast. 120 tablet 3   mesalamine (ROWASA) 4 g enema PLACE 60 MLS RECTALLY TWICE DAILY 14481 mL 0   OVER THE COUNTER MEDICATION Take 1 tablet by mouth as needed. Pt taking Tumeric     pantoprazole (PROTONIX) 40 MG tablet Take 1 tablet by mouth once daily 60 tablet 3   Probiotic Product (PROBIOTIC DAILY PO) Take 1 tablet by mouth as needed.     traMADol (ULTRAM) 50 MG tablet Take 1 tablet (50 mg total) by mouth every 6 (six) hours as needed. 30 tablet 0   ciclopirox (PENLAC) 8 % solution Apply topically.     Facility-Administered Medications Prior to Visit  Medication Dose Route Frequency Provider Last Rate Last Admin   0.9 %  sodium chloride infusion  500 mL Intravenous Once Thornton Park, MD          ROS:  Review of Systems  Constitutional:  Positive for fatigue. Negative for fever and unexpected weight change.  Respiratory:  Negative for cough, shortness of breath and wheezing.   Cardiovascular:  Negative for chest pain, palpitations and leg swelling.  Gastrointestinal:  Negative for blood in stool, constipation, diarrhea, nausea and vomiting.  Endocrine: Negative for cold intolerance, heat intolerance and polyuria.  Genitourinary:  Negative for dyspareunia, dysuria, flank pain, frequency, genital sores, hematuria, menstrual problem, pelvic pain, urgency, vaginal bleeding, vaginal discharge and vaginal pain.  Musculoskeletal:  Negative for arthralgias, back pain, joint swelling and myalgias.  Skin:  Negative for  rash.  Neurological:  Negative for dizziness, syncope, light-headedness, numbness and headaches.  Hematological:  Negative for adenopathy.  Psychiatric/Behavioral:  Positive for sleep disturbance. Negative for agitation, confusion and suicidal ideas. The patient is not nervous/anxious.  BREAST: No symptoms   Objective: BP 140/90    Ht 5' (1.524  m)    Wt 145 lb (65.8 kg)    LMP 10/04/2020 (Approximate)    BMI 28.32 kg/m    Physical Exam Constitutional:      Appearance: She is well-developed.  Genitourinary:     Vulva normal.     Right Labia: No rash, tenderness or lesions.    Left Labia: No tenderness, lesions or rash.    No vaginal discharge, erythema or tenderness.      Right Adnexa: not tender and no mass present.    Left Adnexa: not tender and no mass present.    No cervical friability or polyp.     Uterus is not enlarged or tender.  Breasts:    Right: No mass, nipple discharge, skin change or tenderness.     Left: No mass, nipple discharge, skin change or tenderness.  Neck:     Thyroid: No thyromegaly.  Cardiovascular:     Rate and Rhythm: Normal rate and regular rhythm.     Heart sounds: Normal heart sounds. No murmur heard. Pulmonary:     Effort: Pulmonary effort is normal.     Breath sounds: Normal breath sounds.  Abdominal:     Palpations: Abdomen is soft.     Tenderness: There is no abdominal tenderness. There is no guarding or rebound.  Musculoskeletal:        General: Normal range of motion.     Cervical back: Normal range of motion.  Lymphadenopathy:     Cervical: No cervical adenopathy.  Neurological:     General: No focal deficit present.     Mental Status: She is alert and oriented to person, place, and time.     Cranial Nerves: No cranial nerve deficit.  Skin:    General: Skin is warm and dry.  Psychiatric:        Mood and Affect: Mood normal.        Behavior: Behavior normal.        Thought Content: Thought content normal.        Judgment:  Judgment normal.  Vitals reviewed.    Assessment/Plan: Encounter for annual routine gynecological examination  Cervical cancer screening - Plan: Cytology - PAP  Screening for HPV (human papillomavirus) - Plan: Cytology - PAP  Encounter for screening mammogram for malignant neoplasm of breast; pt has appt today  Perimenopause--f/u prn AUB. Discussed melatonin for sleep/SSRI prn moods. Pt to f/u prn.       GYN counsel breast self exam, mammography screening, adequate intake of calcium and vitamin D, diet and exercise     F/U  Return in about 1 year (around 01/07/2022).  Dinora Hemm B. Joycie Aerts, PA-C 01/07/2021 10:34 AM

## 2021-01-07 ENCOUNTER — Other Ambulatory Visit: Payer: Self-pay

## 2021-01-07 ENCOUNTER — Ambulatory Visit (INDEPENDENT_AMBULATORY_CARE_PROVIDER_SITE_OTHER): Payer: BC Managed Care – PPO | Admitting: Obstetrics and Gynecology

## 2021-01-07 ENCOUNTER — Other Ambulatory Visit (HOSPITAL_COMMUNITY)
Admission: RE | Admit: 2021-01-07 | Discharge: 2021-01-07 | Disposition: A | Discharge status: Home / Self Care | Payer: BC Managed Care – PPO | Source: Ambulatory Visit | Attending: Obstetrics and Gynecology | Admitting: Obstetrics and Gynecology

## 2021-01-07 ENCOUNTER — Ambulatory Visit
Admission: RE | Admit: 2021-01-07 | Discharge: 2021-01-07 | Disposition: A | Discharge status: Home / Self Care | Payer: BC Managed Care – PPO | Source: Ambulatory Visit | Attending: Obstetrics and Gynecology | Admitting: Obstetrics and Gynecology

## 2021-01-07 ENCOUNTER — Encounter: Payer: Self-pay | Admitting: Obstetrics and Gynecology

## 2021-01-07 VITALS — BP 140/90 | Ht 60.0 in | Wt 145.0 lb

## 2021-01-07 DIAGNOSIS — Z124 Encounter for screening for malignant neoplasm of cervix: Secondary | ICD-10-CM | POA: Diagnosis not present

## 2021-01-07 DIAGNOSIS — Z1151 Encounter for screening for human papillomavirus (HPV): Secondary | ICD-10-CM

## 2021-01-07 DIAGNOSIS — N951 Menopausal and female climacteric states: Secondary | ICD-10-CM

## 2021-01-07 DIAGNOSIS — Z01419 Encounter for gynecological examination (general) (routine) without abnormal findings: Secondary | ICD-10-CM

## 2021-01-07 DIAGNOSIS — Z1231 Encounter for screening mammogram for malignant neoplasm of breast: Secondary | ICD-10-CM

## 2021-01-07 NOTE — Patient Instructions (Signed)
I value your feedback and you entrusting us with your care. If you get a [facility] patient survey, I would appreciate you taking the time to let us know about your experience today. Thank you! ? ? ?

## 2021-01-13 ENCOUNTER — Encounter: Payer: Self-pay | Admitting: Obstetrics and Gynecology

## 2021-01-13 LAB — CYTOLOGY - PAP
Comment: NEGATIVE
Diagnosis: UNDETERMINED — AB
High risk HPV: NEGATIVE

## 2021-01-29 DIAGNOSIS — D0471 Carcinoma in situ of skin of right lower limb, including hip: Secondary | ICD-10-CM | POA: Diagnosis not present

## 2021-01-29 DIAGNOSIS — C44519 Basal cell carcinoma of skin of other part of trunk: Secondary | ICD-10-CM | POA: Diagnosis not present

## 2021-01-29 DIAGNOSIS — L988 Other specified disorders of the skin and subcutaneous tissue: Secondary | ICD-10-CM | POA: Diagnosis not present

## 2021-02-23 ENCOUNTER — Ambulatory Visit: Payer: BC Managed Care – PPO | Admitting: Gastroenterology

## 2021-03-25 ENCOUNTER — Telehealth: Payer: Self-pay | Admitting: Family Medicine

## 2021-03-25 DIAGNOSIS — Z841 Family history of disorders of kidney and ureter: Secondary | ICD-10-CM

## 2021-03-25 NOTE — Telephone Encounter (Signed)
Labs ordered.  ? ?Please let pt know that I do not think this will be covered by insurance. If she is concerned about cost - she could donate blood and they will notify her of her blood type.  ? ?She will need a lab appointment if she wants to get it at our office.  ?

## 2021-03-25 NOTE — Telephone Encounter (Signed)
Pt has called wanting to know if someone is able to tell her her bloodtype. She is wanting to now if she is a Orthoptist for her brothers kidney transplant, Please advise ? ?

## 2021-03-25 NOTE — Telephone Encounter (Signed)
Spoke to pt and relayed message. Pt will call her insurance company to find out what the cost will be before proceeding.  ?

## 2021-06-10 IMAGING — MG DIGITAL SCREENING BILAT W/ TOMO W/ CAD
8 series · 8 of 24 positions shown · non-contrast
Comparison: Previous exam(s).

CLINICAL DATA: Screening.

EXAM:
DIGITAL SCREENING BILATERAL MAMMOGRAM WITH TOMO AND CAD

[R CC synth-2D]
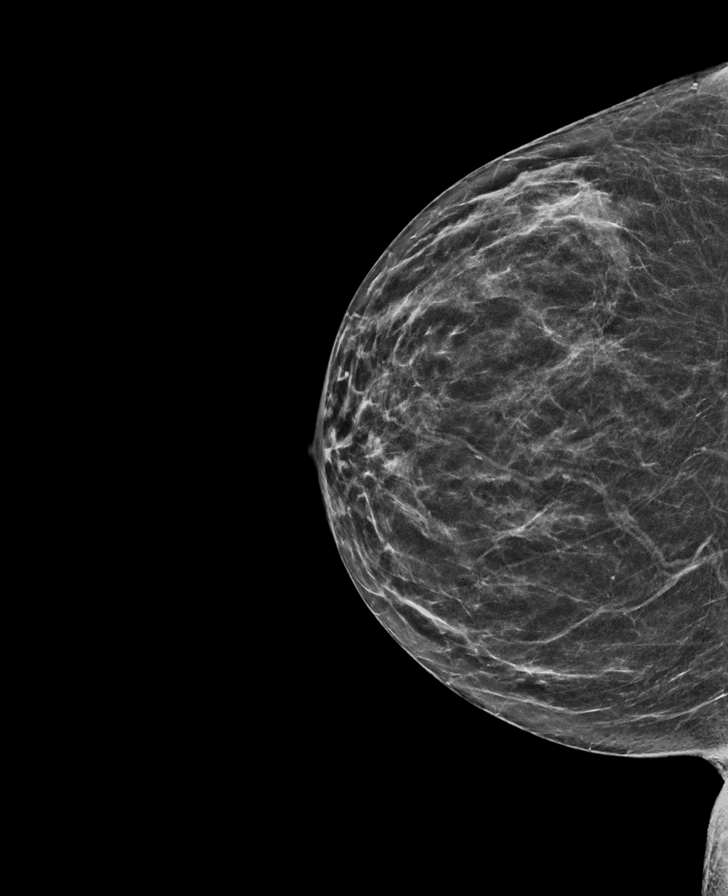

[L CC synth-2D]
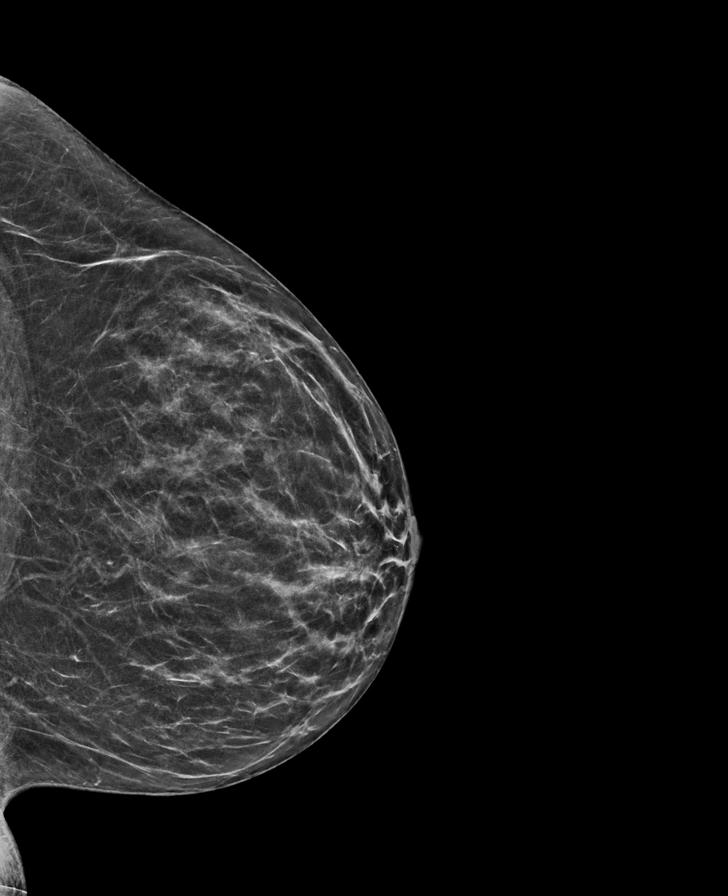

[R MLO synth-2D]
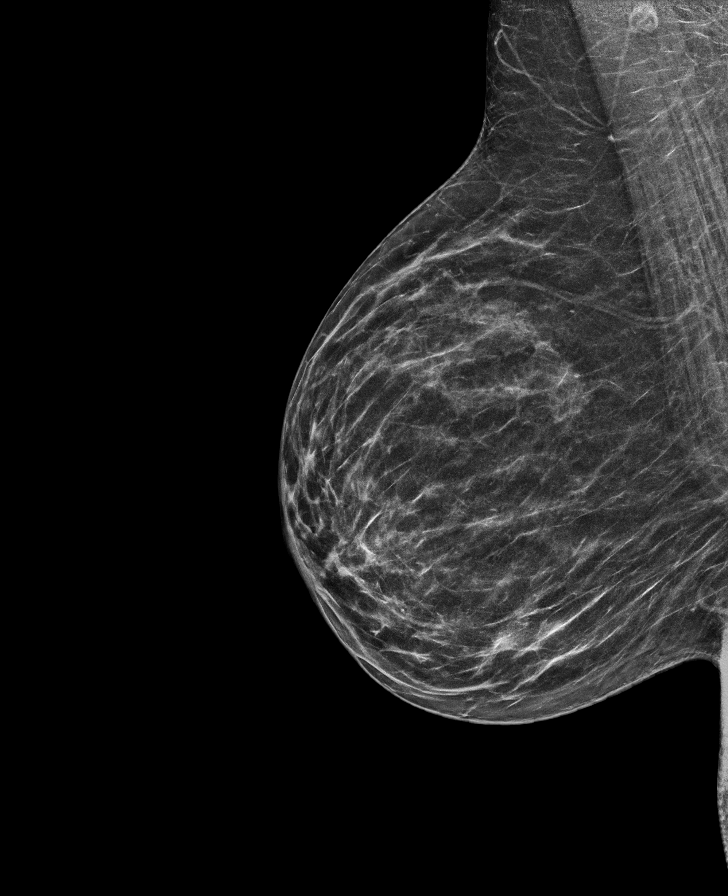

[L MLO synth-2D]
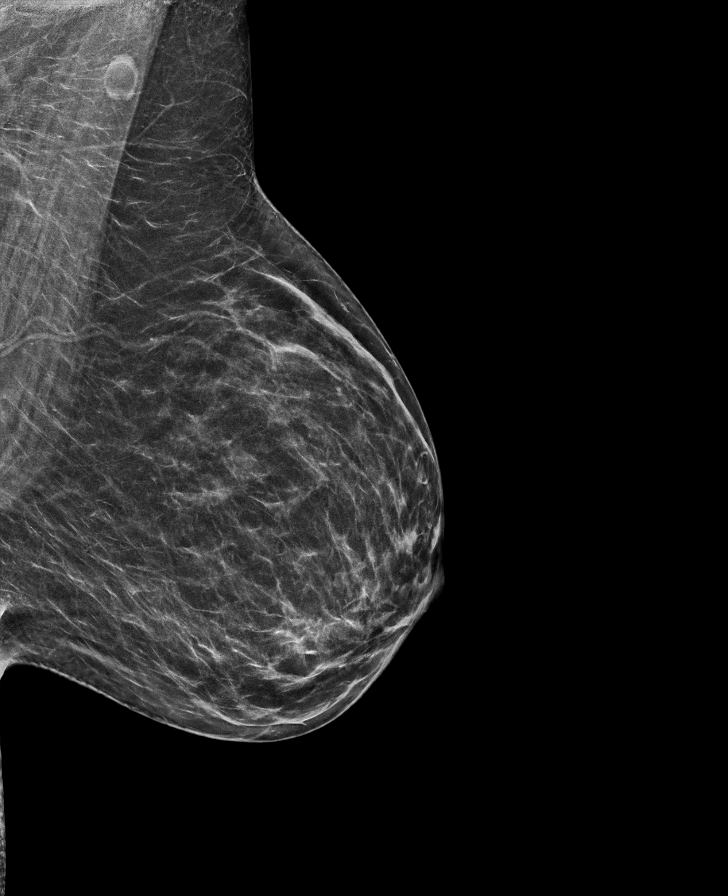

[L CC tomo · tomo slice 27/54.0]
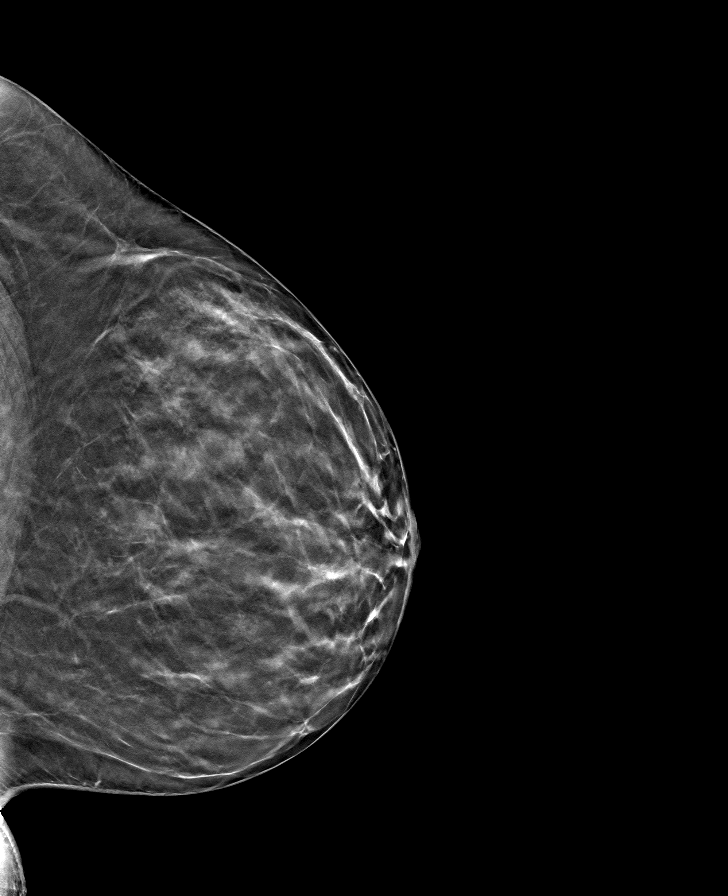

[L MLO tomo · tomo slice 31/61.0]
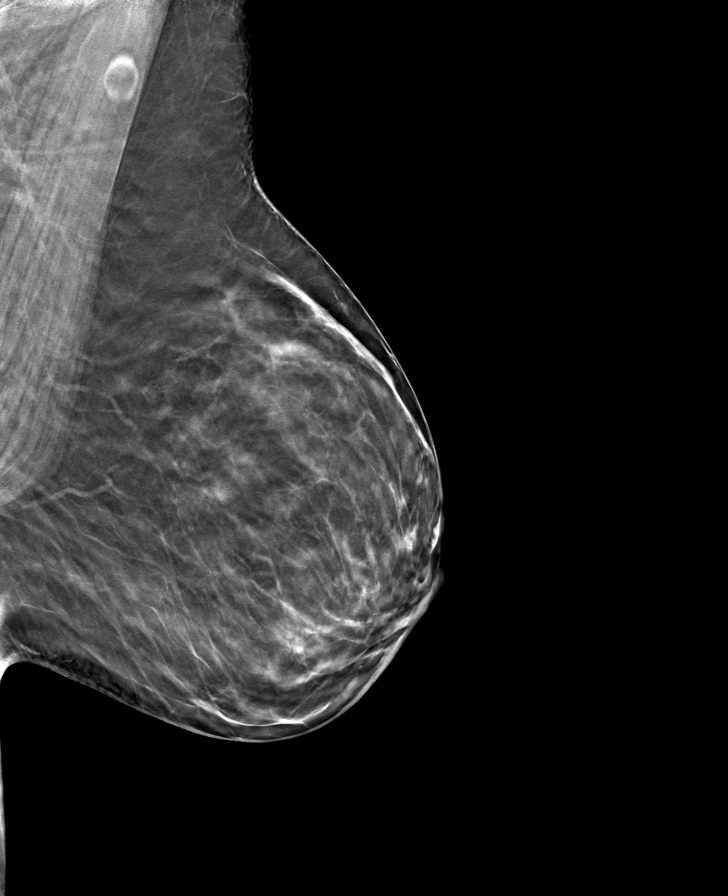

[R CC tomo · tomo slice 28/55.0]
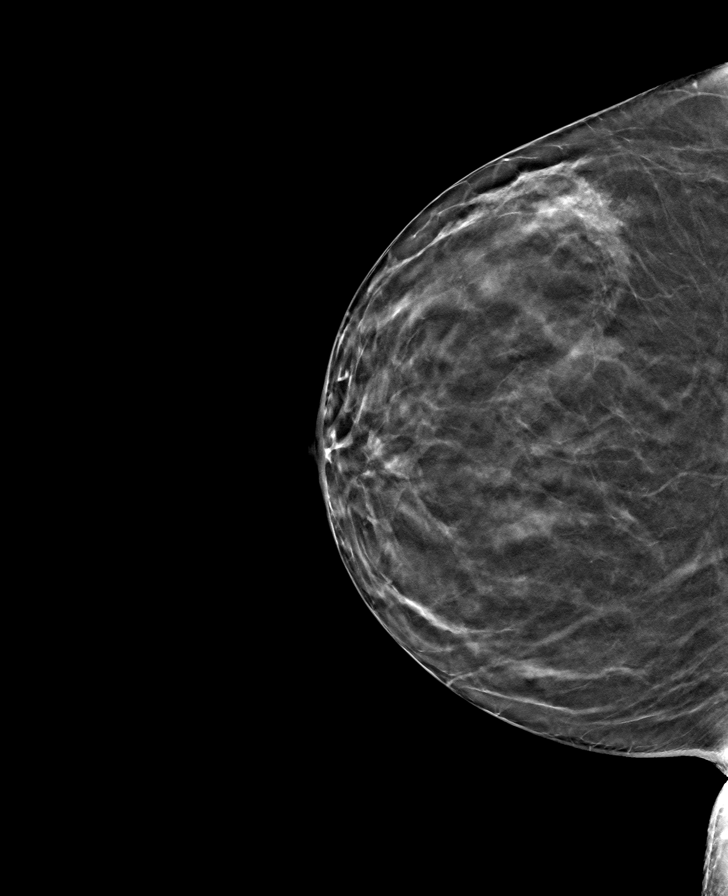

[R MLO tomo · tomo slice 31/60.0]
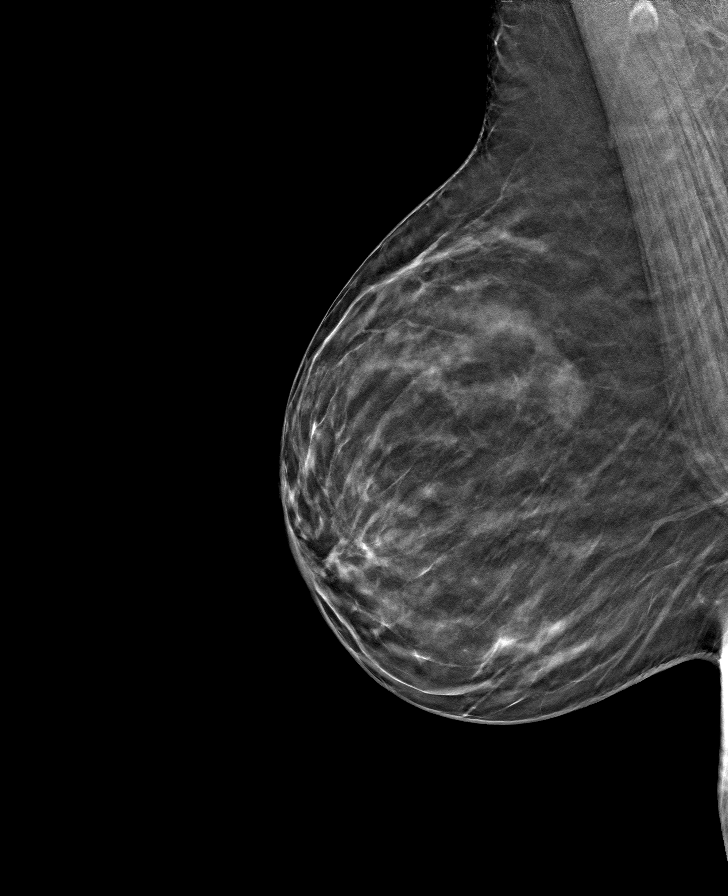

[8 of 24 positions shown; findings below may reference images not displayed]

ACR Breast Density Category b: There are scattered areas of
fibroglandular density.
FINDINGS: There are no findings suspicious for malignancy. Images were
processed with CAD.
IMPRESSION: No mammographic evidence of malignancy. A result letter of this
screening mammogram will be mailed directly to the patient.

RECOMMENDATION:
Screening mammogram in one year. (Code:[id number])

BI-RADS CATEGORY  1: Negative.

## 2021-10-29 ENCOUNTER — Ambulatory Visit (HOSPITAL_COMMUNITY)
Admission: RE | Admit: 2021-10-29 | Discharge: 2021-10-29 | Disposition: A | Discharge status: Home / Self Care | Payer: BC Managed Care – PPO | Source: Ambulatory Visit | Attending: Physician Assistant | Admitting: Physician Assistant

## 2021-10-29 ENCOUNTER — Other Ambulatory Visit: Payer: Self-pay

## 2021-10-29 ENCOUNTER — Ambulatory Visit (INDEPENDENT_AMBULATORY_CARE_PROVIDER_SITE_OTHER): Payer: BC Managed Care – PPO | Admitting: Physician Assistant

## 2021-10-29 ENCOUNTER — Other Ambulatory Visit (INDEPENDENT_AMBULATORY_CARE_PROVIDER_SITE_OTHER): Payer: BC Managed Care – PPO

## 2021-10-29 ENCOUNTER — Encounter: Payer: Self-pay | Admitting: Physician Assistant

## 2021-10-29 VITALS — BP 110/86 | HR 90 | Ht 60.0 in | Wt 141.6 lb

## 2021-10-29 DIAGNOSIS — R1011 Right upper quadrant pain: Secondary | ICD-10-CM

## 2021-10-29 DIAGNOSIS — R195 Other fecal abnormalities: Secondary | ICD-10-CM

## 2021-10-29 DIAGNOSIS — K921 Melena: Secondary | ICD-10-CM

## 2021-10-29 LAB — CBC WITH DIFFERENTIAL/PLATELET
Basophils Absolute: 0 10*3/uL (ref 0.0–0.1)
Basophils Relative: 0.3 % (ref 0.0–3.0)
Eosinophils Absolute: 0.1 10*3/uL (ref 0.0–0.7)
Eosinophils Relative: 1.4 % (ref 0.0–5.0)
HCT: 40.1 % (ref 36.0–46.0)
Hemoglobin: 13.3 g/dL (ref 12.0–15.0)
Lymphocytes Relative: 20.4 % (ref 12.0–46.0)
Lymphs Abs: 1 10*3/uL (ref 0.7–4.0)
MCHC: 33.2 g/dL (ref 30.0–36.0)
MCV: 91.5 fl (ref 78.0–100.0)
Monocytes Absolute: 0.4 10*3/uL (ref 0.1–1.0)
Monocytes Relative: 7.9 % (ref 3.0–12.0)
Neutro Abs: 3.4 10*3/uL (ref 1.4–7.7)
Neutrophils Relative %: 70 % (ref 43.0–77.0)
Platelets: 313 10*3/uL (ref 150.0–400.0)
RBC: 4.39 Mil/uL (ref 3.87–5.11)
RDW: 13.9 % (ref 11.5–15.5)
WBC: 4.9 10*3/uL (ref 4.0–10.5)

## 2021-10-29 LAB — COMPREHENSIVE METABOLIC PANEL
ALT: 13 U/L (ref 0–35)
AST: 16 U/L (ref 0–37)
Albumin: 4 g/dL (ref 3.5–5.2)
Alkaline Phosphatase: 52 U/L (ref 39–117)
BUN: 7 mg/dL (ref 6–23)
CO2: 26 mEq/L (ref 19–32)
Calcium: 9.2 mg/dL (ref 8.4–10.5)
Chloride: 101 mEq/L (ref 96–112)
Creatinine, Ser: 0.5 mg/dL (ref 0.40–1.20)
GFR: 110.49 mL/min (ref >60.00)
Glucose, Bld: 88 mg/dL (ref 70–99)
Potassium: 3.7 mEq/L (ref 3.5–5.1)
Sodium: 136 mEq/L (ref 135–145)
Total Bilirubin: 0.4 mg/dL (ref 0.2–1.2)
Total Protein: 7.3 g/dL (ref 6.0–8.3)

## 2021-10-29 LAB — C-REACTIVE PROTEIN: CRP: <1 mg/dL (ref 0.5–20.0)

## 2021-10-29 MED ORDER — MESALAMINE 1.2 G PO TBEC: 1.2000 g | DELAYED_RELEASE_TABLET | Freq: Four times a day (QID) | ORAL | 11 refills | Status: DC

## 2021-10-29 MED ORDER — MESALAMINE 4 G RE ENEM: ENEMA | RECTAL | 3 refills | Status: DC

## 2021-10-29 MED ORDER — BUDESONIDE 3 MG PO CPEP: 9.0000 mg | ORAL_CAPSULE | Freq: Every day | ORAL | 4 refills | Status: DC

## 2021-10-29 NOTE — Patient Instructions (Addendum)
Your provider has requested that you go to the basement level for lab work before leaving today. Press "B" on the elevator. The lab is located at the first door on the left as you exit the elevator.   You have been scheduled for an abdominal ultrasound at [facility] (1st floor of hospital) on today at 3 pm. Please arrive 30  minutes prior to your appointment for registration. Make certain not to have anything to eat or drink 6 hours prior to your appointment. Should you need to reschedule your appointment, please contact radiology at [phone]. This test typically takes about 30 minutes to perform.   We have sent the following medications to your pharmacy for you to pick up at your convenience: Lialda, Rowasa, Budesonide.  Due to recent changes in healthcare laws, you may see the results of your imaging and laboratory studies on MyChart before your provider has had a chance to review them.  We understand that in some cases there may be results that are confusing or concerning to you. Not all laboratory results come back in the same time frame and the provider may be waiting for multiple results in order to interpret others.  Please give [place] 48 hours in order for your provider to thoroughly review all the results before contacting the office for clarification of your results.    It was a pleasure to see you today!  Thank you for trusting me with your gastrointestinal care!

## 2021-10-29 NOTE — Progress Notes (Signed)
Subjective:    Patient ID: Ebony [name], female    DOB: 1970-06-11, 51 y.o.   MRN: [id number]  HPI Ebony [name] is a pleasant 51 year old white female, established with Dr. Tarri Glenn, who was last seen here in September 2021 and has a diagnosis of ulcerative rectosigmoiditis.  She was initially diagnosed in 1995.  Last colonoscopy July 2020 with finding of an anal fissure, and an area of continuous nonbleeding ulcerated mucosa from the rectum to the sigmoid and otherwise normal-appearing colon.  Biopsies at that time were unremarkable with the exception of the rectosigmoid which showed active colitis without dysplasia.  She was treated with a combination of Rowasa enemas budesonide and Lialda and then underwent flexible sigmoidoscopy December 2021 with no residual active colitis noted and all biopsies were negative for active inflammation.  She was to wean off the Rowasa enemas.  She comes back in today primarily with new complaint of right upper quadrant pain but also says that she feels that her colitis has been active over the past few months at least. She has not been on any medication for colitis over at least the past year as she had not followed up and ran out of medications.  She is usually having at least 8 looser bowel movements each day in the morning hours, has noticed blood and mucus in her stool intermittently and has lower abdominal tenderness/discomfort which she says is typical when her colitis flares.  Her most acute issue is that of right upper quadrant pain which started this past Sunday about 5 days ago.  She says she developed a constant fairly intense pain in the right upper quadrant radiating into her back that evening which lasted all throughout the night and into the next day, probably at least 18 hours in duration.  She did not have any associated fever or chills no diarrhea, no nausea or vomiting but says she remained tender the following day in the right upper quadrant. On  Tuesday she felt okay early in the day, was able to eat but then Tuesday night had another episode very similar with right upper quadrant pain which was constant radiating into the back and lasting for 16 to 18 hours.  She remains tender today.    Review of Systems Pertinent positive and negative review of systems were noted in the above HPI section.  All other review of systems was otherwise negative.   Outpatient Encounter Medications as of 10/29/2021  Medication Sig   augmented betamethasone dipropionate (DIPROLENE-AF) 0.05 % cream APPLY ON RASH TWICE DAILY AS NEEDED FOR FLARES   BIOTIN PO Take 1 tablet by mouth daily.   cholecalciferol (VITAMIN D3) 25 MCG (1000 UNIT) tablet Take 1,000 Units by mouth daily.   Flaxseed, Linseed, (FLAXSEED OIL PO) Take 1 tablet by mouth as needed.   Probiotic Product (PROBIOTIC DAILY PO) Take 1 tablet by mouth as needed.   traMADol (ULTRAM) 50 MG tablet Take 1 tablet (50 mg total) by mouth every 6 (six) hours as needed.   [DISCONTINUED] mesalamine (ROWASA) 4 g enema PLACE 60 MLS RECTALLY TWICE DAILY   budesonide (ENTOCORT EC) 3 MG 24 hr capsule Take 3 capsules (9 mg total) by mouth daily.   mesalamine (LIALDA) 1.2 g EC tablet Take 1 tablet (1.2 g total) by mouth QID.   mesalamine (ROWASA) 4 g enema PLACE 60 MLS RECTALLY QHS   OVER THE COUNTER MEDICATION Take 1 tablet by mouth as needed. Pt taking Tumeric (Patient not taking: Reported on  10/29/2021)   pantoprazole (PROTONIX) 40 MG tablet Take 1 tablet by mouth once daily (Patient not taking: Reported on 10/29/2021)   [DISCONTINUED] budesonide (ENTOCORT EC) 3 MG 24 hr capsule TAKE 3 CAPSULES BY MOUTH ONCE DAILY (Patient not taking: Reported on 10/29/2021)   [DISCONTINUED] mesalamine (LIALDA) 1.2 g EC tablet Take 4 tablets (4.8 g total) by mouth daily with breakfast. (Patient not taking: Reported on 10/29/2021)   Facility-Administered Encounter Medications as of 10/29/2021  Medication   0.9 %  sodium chloride  infusion   No Known Allergies Patient Active Problem List   Diagnosis Date Noted   Aphasia 03/24/2020   Sinus headache 03/24/2020   Vision changes 03/24/2020   Prediabetes 11/19/2019   Mixed hyperlipidemia 10/08/2019   Palpitations 10/08/2019   Joint stiffness 10/08/2019   Iron deficiency anemia due to chronic blood loss 11/16/2018   Other fatigue 11/16/2018   Intractable menstrual migraine without status migrainosus 12/07/2017   Ulcerative colitis ([place]) 12/07/2017   Family history of MI (myocardial infarction) 12/07/2017   Social History   Socioeconomic History   Marital status: Single    Spouse name: Not on file   Number of children: 1   Years of education: High school   Highest education level: Not on file  Occupational History   Occupation: Sales  Tobacco Use   Smoking status: Never   Smokeless tobacco: Never  Vaping Use   Vaping Use: Never used  Substance and Sexual Activity   Alcohol use: Yes    Alcohol/week: 0.0 standard drinks of alcohol    Comment: Rarely-about 2 times a year if that   Drug use: No   Sexual activity: Not Currently    Birth control/protection: Condom  Other Topics Concern   Not on file  Social History Narrative   11/16/18   From: the area   Living: son tyler and boyfriend Dominica Severin (15 yos)   Work: in Press photographer      Family: Has son Dorothea Ogle (70 yo), boyfriend      Enjoys: cooking for other people, walking/hiking      Exercise: walking and weight training   Diet: small meals, only once per day      Safety   Seat belts: Yes    Guns: Yes  and secure   Safe in relationships: Yes    Social Determinants of Health   Financial Resource Strain: Low Risk  (11/16/2018)   Overall Financial Resource Strain (CARDIA)    Difficulty of Paying Living Expenses: Not hard at all  Food Insecurity: Not on file  Transportation Needs: Not on file  Physical Activity: Not on file  Stress: Not on file  Social Connections: Not on file  Intimate Partner Violence:  Not on file    Ebony [name]'s family history includes Dementia in her mother; Diabetes in her brother, father, and mother; Heart attack in her maternal grandmother; Heart attack (age of onset: 30) in her father; Heart attack (age of onset: 8) in her brother; Heart attack (age of onset: 52) in her mother; Heart disease in her brother, father, and maternal grandmother; Heart failure in her brother; Hyperlipidemia in her father; Hypertension in her father and mother; Other in her father and paternal grandmother; Stroke in her paternal grandmother.      Objective:    Vitals:   10/29/21 0907  BP: 110/86  Pulse: 90  SpO2: 99%    Physical Exam Well-developed well-nourished older WF in no acute distress.  Height, Weight,141 BMI 27.65  HEENT;  nontraumatic normocephalic, EOMI, PE R LA, sclera anicteric. Oropharynx;not examined today  Neck; supple, no JVD Cardiovascular; regular rate and rhythm with S1-S2, no murmur rub or gallop Pulmonary; Clear bilaterally Abdomen; soft, she is tender in the right upper quadrant, no rebound, also has some mild tenderness in the left mid and left lower quadrant, nondistended, no palpable mass or hepatosplenomegaly, bowel sounds are active Rectal; not done today Skin; benign exam, no jaundice rash or appreciable lesions Extremities; no clubbing cyanosis or edema skin warm and dry Neuro/Psych; alert and oriented x4, grossly nonfocal mood and affect appropriate        Assessment & Plan:   #103 51 year old white female with acute onset of right upper quadrant pain radiating into the back about 5 days ago and described as constant and lasting for about 18 hours in duration without associated fever chills nausea vomiting etc. She had recurrence of the same symptoms Tuesday evening lasting through part of the day yesterday, and remains tender today  Symptoms are very suspicious for biliary colic, rule out component of cholecystitis  #2 Long history of  ulcerative proctosigmoiditis initially diagnosed 1995-no active disease at the time of last colon in December 2021. However patient had not come in for follow-up and has not been on any maintenance medication over at least the past year and has developed recurrent symptoms consistent with at least mildly active colitis with lower abdominal discomfort increased frequency of bowel movements up to 8-9 bowel movements per day and noting intermittent bloody mucus.  Plan; CBC with differential, c-Met, lipase, and fecal calprotectin Patient has been scheduled for stat ultrasound which will be completed later this afternoon Will refill Lialda 1.2 g, 4 tablets p.o. daily Restart Rowasa enemas 1 per rectum nightly x30 days then will reassess Will restart budesonide 3 mg, 3 p.o. every morning, and plan for at least 8-week course and then tapering depending on response.  Further recommendations pending results of ultrasound. We discussed possibility of acute gallbladder disease and need for urgent surgical referral.  She is also been advised that if she has a prolonged episode in the upcoming days that does not resolve that she should go to the emergency room for evaluation   Alfredia Ferguson PA-C 10/29/2021   Cc: Waunita Schooner, MD

## 2021-11-10 NOTE — Progress Notes (Signed)
Reviewed and agree with management plans. ? ?Tnya Ades L. Mckinzy Fuller, MD, MPH  ?

## 2021-11-12 ENCOUNTER — Other Ambulatory Visit: Payer: BC Managed Care – PPO

## 2021-11-12 DIAGNOSIS — K921 Melena: Secondary | ICD-10-CM | POA: Diagnosis not present

## 2021-11-12 DIAGNOSIS — R195 Other fecal abnormalities: Secondary | ICD-10-CM

## 2021-11-12 DIAGNOSIS — R1011 Right upper quadrant pain: Secondary | ICD-10-CM | POA: Diagnosis not present

## 2021-11-17 LAB — CALPROTECTIN, FECAL: Calprotectin, Fecal: 1060 ug/g — ABNORMAL HIGH (ref 0–120)

## 2021-11-23 ENCOUNTER — Ambulatory Visit (INDEPENDENT_AMBULATORY_CARE_PROVIDER_SITE_OTHER): Payer: BC Managed Care – PPO | Admitting: Family

## 2021-11-23 ENCOUNTER — Encounter: Payer: Self-pay | Admitting: Family

## 2021-11-23 VITALS — BP 122/78 | HR 78 | Temp 98.4°F | Resp 16 | Ht 60.0 in | Wt 140.2 lb

## 2021-11-23 DIAGNOSIS — E7800 Pure hypercholesterolemia, unspecified: Secondary | ICD-10-CM | POA: Diagnosis not present

## 2021-11-23 DIAGNOSIS — Z23 Encounter for immunization: Secondary | ICD-10-CM

## 2021-11-23 DIAGNOSIS — K5180 Other ulcerative colitis without complications: Secondary | ICD-10-CM

## 2021-11-23 DIAGNOSIS — E559 Vitamin D deficiency, unspecified: Secondary | ICD-10-CM | POA: Diagnosis not present

## 2021-11-23 DIAGNOSIS — Z0001 Encounter for general adult medical examination with abnormal findings: Secondary | ICD-10-CM | POA: Insufficient documentation

## 2021-11-23 DIAGNOSIS — D500 Iron deficiency anemia secondary to blood loss (chronic): Secondary | ICD-10-CM

## 2021-11-23 DIAGNOSIS — R002 Palpitations: Secondary | ICD-10-CM

## 2021-11-23 DIAGNOSIS — J3489 Other specified disorders of nose and nasal sinuses: Secondary | ICD-10-CM

## 2021-11-23 DIAGNOSIS — R7989 Other specified abnormal findings of blood chemistry: Secondary | ICD-10-CM | POA: Insufficient documentation

## 2021-11-23 LAB — LIPID PANEL
Cholesterol: 202 mg/dL — ABNORMAL HIGH (ref 0–200)
HDL: 63.5 mg/dL (ref >39.00)
LDL Cholesterol: 120 mg/dL — ABNORMAL HIGH (ref 0–99)
NonHDL: 138.72
Total CHOL/HDL Ratio: 3
Triglycerides: 92 mg/dL (ref 0.0–149.0)
VLDL: 18.4 mg/dL (ref 0.0–40.0)

## 2021-11-23 LAB — T4, FREE: Free T4: 0.84 ng/dL (ref 0.60–1.60)

## 2021-11-23 LAB — TSH: TSH: 2.88 u[IU]/mL (ref 0.35–5.50)

## 2021-11-23 LAB — VITAMIN D 25 HYDROXY (VIT D DEFICIENCY, FRACTURES): VITD: 27.72 ng/mL — ABNORMAL LOW (ref 30.00–100.00)

## 2021-11-23 LAB — T3, FREE: T3, Free: 3.4 pg/mL (ref 2.3–4.2)

## 2021-11-23 MED ORDER — MUPIROCIN 2 % EX OINT: 1.0000 | TOPICAL_OINTMENT | Freq: Two times a day (BID) | CUTANEOUS | 0 refills | Status: AC

## 2021-11-23 NOTE — Patient Instructions (Addendum)
------------------------------------     Start daily flonase and nightly zyrtec.     ------------------------------------ Welcome to our clinic, I am happy to have you as my new patient. I am excited to continue on this healthcare journey with you.  Stop by the lab prior to leaving today. I will notify you of your results once received.   Please keep in mind Any my chart messages you send have up to a three business day turnaround for a response.  Phone calls may take up to a one full business day turnaround for a  response.   If you need a medication refill I recommend you request it through the pharmacy as this is easiest for [place] rather than sending a message and or phone call.   Due to recent changes in healthcare laws, you may see results of your imaging and/or laboratory studies on MyChart before I have had a chance to review them.  I understand that in some cases there may be results that are confusing or concerning to you. Please understand that not all results are received at the same time and often I may need to interpret multiple results in order to provide you with the best plan of care or course of treatment. Therefore, I ask that you please give me 2 business days to thoroughly review all your results before contacting my office for clarification. Should we see a critical lab result, you will be contacted sooner.   It was a pleasure seeing you today! Please do not hesitate to reach out with any questions and or concerns.  Regards,   Eugenia Pancoast FNP-C

## 2021-11-23 NOTE — Assessment & Plan Note (Signed)
Improving.

## 2021-11-23 NOTE — Addendum Note (Signed)
Addended by: Ellamae Sia on: 11/23/2021 03:51 PM   Modules accepted: Orders

## 2021-11-23 NOTE — Assessment & Plan Note (Signed)
Patient Counseling(The following topics were reviewed):  Preventative care handout given to pt  Health maintenance and immunizations reviewed. Please refer to Health maintenance section. Pt advised on safe sex, wearing seatbelts in car, and proper nutrition labwork ordered today for annual Dental health: Discussed importance of regular tooth brushing, flossing, and dental visits.  Flu vaccine today administered in office.

## 2021-11-23 NOTE — Assessment & Plan Note (Signed)
Slight elevation last visit, pt is symptomatic. Will repeat thyroid levels, and see if we might consider treatment.

## 2021-11-23 NOTE — Assessment & Plan Note (Signed)
Ordered lipid panel, pending results. Work on low cholesterol diet and exercise as tolerated ? ?

## 2021-11-23 NOTE — Assessment & Plan Note (Signed)
Continue f/u with GI and continue medications as prescribed.

## 2021-11-23 NOTE — Progress Notes (Signed)
Established Patient Office Visit  Subjective:  Patient ID: Ebony [name], female    DOB: 1970-04-02  Age: 51 y.o. MRN: [id number]  CC:  Chief Complaint  Patient presents with   Transitions Of Care    HPI [name] is here for a Toc as well as annual Cpe.   Prior provider was: Dr. Waunita Schooner  Pt is without acute concerns.   Wt Readings from Last 3 Encounters:  11/23/21 140 lb 4 oz (63.6 kg)  10/29/21 141 lb 9.6 oz (64.2 kg)  01/07/21 145 lb (65.8 kg)   Lab Results  Component Value Date   TSH 5.35 11/20/2020   Finding its harder to lose weight in the last few months. She doesn't have a huge appetite, and she is trying to work out at [employer]. She does notice thinning of hair, joint stiffness, and fatigue.   Flu vaccine: requesting for today.   Pap : ascus 81/85/63, sees alicia Copland.  Menses now irregular, went 8 months and then came back, and very heavy last period which was about two weeks ago. She had extreme cramping with low back pain, worse than she had before.   chronic concerns:  UC: gastro, in current flare. Sees Dr. Thornton Park. Does seem to fluctuate with her IDA with her UC. Starting mesalamine over again, was off for a while. Also restarte don budesonide.   H/o BCC: sees dermatologist every 6 months to one year.     Lab Results  Component Value Date   HGBA1C 5.4 11/20/2020     Alopecia: does find her hair thins so she takes biotin   Palpitations: occasional only. Not overly concerning. Used to be more frequent.   Migraines: seems to associate with her menstrual cycle. Frequency stable. Does also get frequent sinus headaches which seem to correlate with this as well.   Past Medical History:  Diagnosis Date   Anemia    Arthritis    Blood transfusion without reported diagnosis unknown   for tx uc   Colon polyps 1997 or 1998?   GI bleeding    Ulcerative colitis [facility])     Past Surgical History:  Procedure Laterality Date    BASAL CELL CARCINOMA EXCISION  2020   left breast   COLONOSCOPY  1997 or 1998?   HEMORRHOIDECTOMY WITH HEMORRHOID BANDING      Family History  Problem Relation Age of Onset   Diabetes Mother    Hypertension Mother    Dementia Mother        senile   Heart attack Mother 22   Diabetes Father    Heart disease Father    Hypertension Father    Hyperlipidemia Father    Heart attack Father 62   Other Father        heart bypass and heart transplant   Diabetes Brother    Heart disease Brother    Heart failure Brother    Heart attack Brother 2   Kidney disease Brother        dialysis   Heart disease Maternal Grandmother    Heart attack Maternal Grandmother    Other Paternal Grandmother        carotid blockage   Stroke Paternal Grandmother    Colon cancer Neg Hx    Colon polyps Neg Hx    Esophageal cancer Neg Hx    Gallbladder disease Neg Hx    Breast cancer Neg Hx     Social History   Socioeconomic  History   Marital status: Significant Other    Spouse name: Not on file   Number of children: 1   Years of education: High school   Highest education level: Not on file  Occupational History   Occupation: Scientist, 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  Tobacco Use   Smoking status: Never   Smokeless tobacco: Never  Vaping Use   Vaping Use: Never used  Substance and Sexual Activity   Alcohol use: Yes    Alcohol/week: 0.0 standard drinks of alcohol    Comment: Rarely-about 2 times a year if that   Drug use: No   Sexual activity: Yes    Partners: Male    Birth control/protection: Condom  Other Topics Concern   Not on file  Social History Narrative   11/16/18   From: the area   Living: son tyler and boyfriend Dominica Severin (15 yos)   Work: in Press photographer      Family: Has son Dorothea Ogle (80 yo), boyfriend      Enjoys: cooking for other people, walking/hiking      Exercise: walking and weight training   Diet: small meals, only once per day      Safety   Seat belts: Yes    Guns: Yes  and secure    Safe in relationships: Yes    Social Determinants of Health   Financial Resource Strain: Low Risk  (11/16/2018)   Overall Financial Resource Strain (CARDIA)    Difficulty of Paying Living Expenses: Not hard at all  Food Insecurity: Not on file  Transportation Needs: Not on file  Physical Activity: Not on file  Stress: Not on file  Social Connections: Not on file  Intimate Partner Violence: Not on file    Outpatient Medications Prior to Visit  Medication Sig Dispense Refill   augmented betamethasone dipropionate (DIPROLENE-AF) 0.05 % cream APPLY ON RASH TWICE DAILY AS NEEDED FOR FLARES     BIOTIN PO Take 1 tablet by mouth daily.     budesonide (ENTOCORT EC) 3 MG 24 hr capsule Take 3 capsules (9 mg total) by mouth daily. 90 capsule 4   cholecalciferol (VITAMIN D3) 25 MCG (1000 UNIT) tablet Take 1,000 Units by mouth daily.     Flaxseed, Linseed, (FLAXSEED OIL PO) Take 1 tablet by mouth as needed.     mesalamine (LIALDA) 1.2 g EC tablet Take 1 tablet (1.2 g total) by mouth QID. 120 tablet 11   mesalamine (ROWASA) 4 g enema PLACE 60 MLS RECTALLY QHS 1800 mL 3   Probiotic Product (PROBIOTIC DAILY PO) Take 1 tablet by mouth as needed.     traMADol (ULTRAM) 50 MG tablet Take 1 tablet (50 mg total) by mouth every 6 (six) hours as needed. 30 tablet 0   OVER THE COUNTER MEDICATION Take 1 tablet by mouth as needed. Pt taking Tumeric     pantoprazole (PROTONIX) 40 MG tablet Take 1 tablet by mouth once daily (Patient not taking: Reported on 10/29/2021) 60 tablet 3   Facility-Administered Medications Prior to Visit  Medication Dose Route Frequency Provider Last Rate Last Admin   0.9 %  sodium chloride infusion  500 mL Intravenous Once Thornton Park, MD        No Known Allergies  ROS Review of Systems  Review of Systems  Respiratory:  Negative for shortness of breath.   Cardiovascular:  Negative for chest pain and palpitations.  Gastrointestinal:  Negative for constipation and  diarrhea.  Genitourinary:  Negative for dysuria, frequency  and urgency.  Musculoskeletal:  Negative for myalgias.  Psychiatric/Behavioral:  Negative for depression and suicidal ideas.   All other systems reviewed and are negative.    Objective:    Physical Exam Vitals reviewed.  Constitutional:      General: She is not in acute distress.    Appearance: Normal appearance. She is normal weight. She is not ill-appearing or toxic-appearing.  HENT:     Right Ear: Tympanic membrane normal.     Left Ear: Tympanic membrane normal.     Nose: Congestion present.     Comments: Right nares lateral side with slight lesion with dried blood     Mouth/Throat:     Mouth: Mucous membranes are moist.     Pharynx: Posterior oropharyngeal erythema (cobblestoning) present. No pharyngeal swelling.     Tonsils: No tonsillar exudate.  Eyes:     Extraocular Movements: Extraocular movements intact.     Conjunctiva/sclera: Conjunctivae normal.     Pupils: Pupils are equal, round, and reactive to light.  Neck:     Thyroid: No thyroid mass.  Cardiovascular:     Rate and Rhythm: Normal rate and regular rhythm.  Pulmonary:     Effort: Pulmonary effort is normal.     Breath sounds: Normal breath sounds.  Abdominal:     General: Abdomen is flat. Bowel sounds are normal.     Palpations: Abdomen is soft.  Musculoskeletal:        General: Normal range of motion.  Lymphadenopathy:     Cervical:     Right cervical: No superficial cervical adenopathy.    Left cervical: No superficial cervical adenopathy.  Skin:    General: Skin is warm.     Capillary Refill: Capillary refill takes less than 2 seconds.  Neurological:     General: No focal deficit present.     Mental Status: She is alert and oriented to person, place, and time.  Psychiatric:        Mood and Affect: Mood normal.        Behavior: Behavior normal.        Thought Content: Thought content normal.        Judgment: Judgment normal.      BP  122/78   Pulse 78   Temp 98.4 F (36.9 C)   Resp 16   Ht 5' (1.524 m)   Wt 140 lb 4 oz (63.6 kg)   SpO2 95%   BMI 27.39 kg/m  Wt Readings from Last 3 Encounters:  11/23/21 140 lb 4 oz (63.6 kg)  10/29/21 141 lb 9.6 oz (64.2 kg)  01/07/21 145 lb (65.8 kg)     There are no preventive care reminders to display for this patient.   There are no preventive care reminders to display for this patient.  Lab Results  Component Value Date   TSH 5.35 11/20/2020   Lab Results  Component Value Date   WBC 4.9 10/29/2021   HGB 13.3 10/29/2021   HCT 40.1 10/29/2021   MCV 91.5 10/29/2021   PLT 313.0 10/29/2021   Lab Results  Component Value Date   NA 136 10/29/2021   K 3.7 10/29/2021   CO2 26 10/29/2021   GLUCOSE 88 10/29/2021   BUN 7 10/29/2021   CREATININE 0.50 10/29/2021   BILITOT 0.4 10/29/2021   ALKPHOS 52 10/29/2021   AST 16 10/29/2021   ALT 13 10/29/2021   PROT 7.3 10/29/2021   ALBUMIN 4.0 10/29/2021   CALCIUM 9.2 10/29/2021  GFR 110.49 10/29/2021   Lab Results  Component Value Date   CHOL 210 (H) 11/20/2020   Lab Results  Component Value Date   HDL 69.10 11/20/2020   Lab Results  Component Value Date   LDLCALC 123 (H) 11/20/2020   Lab Results  Component Value Date   TRIG 88.0 11/20/2020   Lab Results  Component Value Date   CHOLHDL 3 11/20/2020   Lab Results  Component Value Date   HGBA1C 5.4 11/20/2020      Assessment & Plan:   Problem List Items Addressed This Visit       Digestive   Ulcerative colitis ([place])    Continue f/u with GI and continue medications as prescribed.        Other   Iron deficiency anemia due to chronic blood loss   Relevant Orders   CBC   Palpitations    Improving.      Encounter for general adult medical examination with abnormal findings    Patient Counseling(The following topics were reviewed):  Preventative care handout given to pt  Health maintenance and immunizations reviewed. Please refer to Health  maintenance section. Pt advised on safe sex, wearing seatbelts in car, and proper nutrition labwork ordered today for annual Dental health: Discussed importance of regular tooth brushing, flossing, and dental visits.  Flu vaccine today administered in office.      Vitamin D deficiency   Relevant Orders   VITAMIN D 25 Hydroxy (Vit-D Deficiency, Fractures)   Elevated LDL cholesterol level - Primary    Ordered lipid panel, pending results. Work on low cholesterol diet and exercise as tolerated       Relevant Orders   Lipid panel   Elevated TSH    Slight elevation last visit, pt is symptomatic. Will repeat thyroid levels, and see if we might consider treatment.      Relevant Orders   Thyroid Peroxidase Antibodies (TPO) (REFL)   T3, free   T4, free   TSH   Other Visit Diagnoses     Lesion of nose       Relevant Medications   mupirocin ointment (BACTROBAN) 2 %   Need for influenza vaccination       Relevant Orders   Flu Vaccine QUAD 71moIM (Fluarix, Fluzone & Alfiuria Quad PF) (Completed)       Meds ordered this encounter  Medications   mupirocin ointment (BACTROBAN) 2 %    Sig: Apply 1 Application topically 2 (two) times daily for 14 days.    Dispense:  28 g    Refill:  0    Order Specific Question:   Supervising Provider    Answer:   BDiona Browner AMY E [[id number]]    Follow-up: Return in about 1 year (around 11/24/2022) for f/u CPE.    TEugenia Pancoast FNP

## 2021-11-24 ENCOUNTER — Other Ambulatory Visit (INDEPENDENT_AMBULATORY_CARE_PROVIDER_SITE_OTHER): Payer: BC Managed Care – PPO

## 2021-11-24 DIAGNOSIS — D500 Iron deficiency anemia secondary to blood loss (chronic): Secondary | ICD-10-CM | POA: Diagnosis not present

## 2021-11-24 LAB — CBC
HCT: 40.6 % (ref 36.0–46.0)
Hemoglobin: 13.3 g/dL (ref 12.0–15.0)
MCHC: 32.8 g/dL (ref 30.0–36.0)
MCV: 91.6 fl (ref 78.0–100.0)
Platelets: 339 10*3/uL (ref 150.0–400.0)
RBC: 4.43 Mil/uL (ref 3.87–5.11)
RDW: 13.4 % (ref 11.5–15.5)
WBC: 4.5 10*3/uL (ref 4.0–10.5)

## 2021-11-24 LAB — THYROID PEROXIDASE ANTIBODIES (TPO) (REFL): Thyroperoxidase Ab SerPl-aCnc: 7 IU/mL (ref <9)

## 2021-11-25 ENCOUNTER — Other Ambulatory Visit: Payer: Self-pay | Admitting: Obstetrics and Gynecology

## 2021-11-25 DIAGNOSIS — Z1231 Encounter for screening mammogram for malignant neoplasm of breast: Secondary | ICD-10-CM

## 2021-12-15 ENCOUNTER — Ambulatory Visit: Payer: BC Managed Care – PPO | Admitting: Gastroenterology

## 2022-01-06 DIAGNOSIS — L821 Other seborrheic keratosis: Secondary | ICD-10-CM | POA: Diagnosis not present

## 2022-01-06 DIAGNOSIS — D225 Melanocytic nevi of trunk: Secondary | ICD-10-CM | POA: Diagnosis not present

## 2022-01-06 DIAGNOSIS — L814 Other melanin hyperpigmentation: Secondary | ICD-10-CM | POA: Diagnosis not present

## 2022-01-06 DIAGNOSIS — Z85828 Personal history of other malignant neoplasm of skin: Secondary | ICD-10-CM | POA: Diagnosis not present

## 2022-01-13 DIAGNOSIS — R87610 Atypical squamous cells of undetermined significance on cytologic smear of cervix (ASC-US): Secondary | ICD-10-CM | POA: Insufficient documentation

## 2022-01-13 NOTE — Progress Notes (Unsigned)
PCP:  Eugenia Pancoast, FNP   No chief complaint on file.    HPI:      [name] is a 51 y.o. G0P0000 who LMP was No LMP recorded. (Menstrual status: Irregular Periods)., presents today for her annual examination.  Her menses are infrequent due to perimenopause (has had ~3 this yr), light to mod flow for 3 days, no AUB, mild dysmen. Had 1 episode AUB 2/21 that resolved. Having occas vasomotor sx and now mood changes/emotional lability with sleep disturbance. Hasn't tried anything for sleep, exercises regularly.    Sex activity: rarely sex active with single partner, no pain/bleeding. Last Pap: 01/07/21  Results were: ASCUS//neg HPV DNA ; repeat in 1 yr Hx of STDs: none  Last mammogram: 01/07/21  Results were: normal--routine follow-up in 12 months. Has mammo sched today. There is no FH of breast cancer. There is no FH of ovarian cancer. The patient does self-breast exams.  Tobacco use: The patient denies current or previous tobacco use. Alcohol use: none No drug use.  Exercise: very active  She does get adequate calcium and Vitamin D in her diet. Labs with PCP. Strong FH CAD on both sides.  Pt with hx of ulcerative colitis. EGD and Colonoscopy 2021, unsure when due next. Seeing Dr. Tarri Glenn at [facility]   Past Medical History:  Diagnosis Date   Anemia    Arthritis    Blood transfusion without reported diagnosis unknown   for tx uc   Colon polyps 1997 or 1998?   GI bleeding    Ulcerative colitis [facility])     Past Surgical History:  Procedure Laterality Date   BASAL CELL CARCINOMA EXCISION  2020   left breast   COLONOSCOPY  1997 or 1998?   HEMORRHOIDECTOMY WITH HEMORRHOID BANDING      Family History  Problem Relation Age of Onset   Diabetes Mother    Hypertension Mother    Dementia Mother        senile   Heart attack Mother 65   Diabetes Father    Heart disease Father    Hypertension Father    Hyperlipidemia Father    Heart attack Father 85   Other  Father        heart bypass and heart transplant   Diabetes Brother    Heart disease Brother    Heart failure Brother    Heart attack Brother 85   Kidney disease Brother        dialysis   Heart disease Maternal Grandmother    Heart attack Maternal Grandmother    Other Paternal Grandmother        carotid blockage   Stroke Paternal Grandmother    Colon cancer Neg Hx    Colon polyps Neg Hx    Esophageal cancer Neg Hx    Gallbladder disease Neg Hx    Breast cancer Neg Hx     Social History   Socioeconomic History   Marital status: Significant Other    Spouse name: Not on file   Number of children: 1   Years of education: High school   Highest education level: Not on file  Occupational History   Occupation: Scientist, 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  Tobacco Use   Smoking status: Never   Smokeless tobacco: Never  Vaping Use   Vaping Use: Never used  Substance and Sexual Activity   Alcohol use: Yes    Alcohol/week: 0.0 standard drinks of alcohol    Comment:  Rarely-about 2 times a year if that   Drug use: No   Sexual activity: Yes    Partners: Male    Birth control/protection: Condom  Other Topics Concern   Not on file  Social History Narrative   11/16/18   From: the area   Living: son tyler and boyfriend Dominica Severin (15 yos)   Work: in Press photographer      Family: Has son Dorothea Ogle (58 yo), boyfriend      Enjoys: cooking for other people, walking/hiking      Exercise: walking and weight training   Diet: small meals, only once per day      Safety   Seat belts: Yes    Guns: Yes  and secure   Safe in relationships: Yes    Social Determinants of Health   Financial Resource Strain: Low Risk  (11/16/2018)   Overall Financial Resource Strain (CARDIA)    Difficulty of Paying Living Expenses: Not hard at all  Food Insecurity: Not on file  Transportation Needs: Not on file  Physical Activity: Not on file  Stress: Not on file  Social Connections: Not on file  Intimate Partner Violence: Not  on file    Outpatient Medications Prior to Visit  Medication Sig Dispense Refill   augmented betamethasone dipropionate (DIPROLENE-AF) 0.05 % cream APPLY ON RASH TWICE DAILY AS NEEDED FOR FLARES     BIOTIN PO Take 1 tablet by mouth daily.     budesonide (ENTOCORT EC) 3 MG 24 hr capsule Take 3 capsules (9 mg total) by mouth daily. 90 capsule 4   cholecalciferol (VITAMIN D3) 25 MCG (1000 UNIT) tablet Take 1,000 Units by mouth daily.     Flaxseed, Linseed, (FLAXSEED OIL PO) Take 1 tablet by mouth as needed.     mesalamine (LIALDA) 1.2 g EC tablet Take 1 tablet (1.2 g total) by mouth QID. 120 tablet 11   mesalamine (ROWASA) 4 g enema PLACE 60 MLS RECTALLY QHS 1800 mL 3   Probiotic Product (PROBIOTIC DAILY PO) Take 1 tablet by mouth as needed.     traMADol (ULTRAM) 50 MG tablet Take 1 tablet (50 mg total) by mouth every 6 (six) hours as needed. 30 tablet 0   Facility-Administered Medications Prior to Visit  Medication Dose Route Frequency Provider Last Rate Last Admin   0.9 %  sodium chloride infusion  500 mL Intravenous Once Thornton Park, MD          ROS:  Review of Systems  Constitutional:  Positive for fatigue. Negative for fever and unexpected weight change.  Respiratory:  Negative for cough, shortness of breath and wheezing.   Cardiovascular:  Negative for chest pain, palpitations and leg swelling.  Gastrointestinal:  Negative for blood in stool, constipation, diarrhea, nausea and vomiting.  Endocrine: Negative for cold intolerance, heat intolerance and polyuria.  Genitourinary:  Negative for dyspareunia, dysuria, flank pain, frequency, genital sores, hematuria, menstrual problem, pelvic pain, urgency, vaginal bleeding, vaginal discharge and vaginal pain.  Musculoskeletal:  Negative for arthralgias, back pain, joint swelling and myalgias.  Skin:  Negative for rash.  Neurological:  Negative for dizziness, syncope, light-headedness, numbness and headaches.  Hematological:   Negative for adenopathy.  Psychiatric/Behavioral:  Positive for sleep disturbance. Negative for agitation, confusion and suicidal ideas. The patient is not nervous/anxious.   BREAST: No symptoms   Objective: There were no vitals taken for this visit.   Physical Exam Constitutional:      Appearance: She is well-developed.  Genitourinary:  Vulva normal.     Right Labia: No rash, tenderness or lesions.    Left Labia: No tenderness, lesions or rash.    No vaginal discharge, erythema or tenderness.      Right Adnexa: not tender and no mass present.    Left Adnexa: not tender and no mass present.    No cervical friability or polyp.     Uterus is not enlarged or tender.  Breasts:    Right: No mass, nipple discharge, skin change or tenderness.     Left: No mass, nipple discharge, skin change or tenderness.  Neck:     Thyroid: No thyromegaly.  Cardiovascular:     Rate and Rhythm: Normal rate and regular rhythm.     Heart sounds: Normal heart sounds. No murmur heard. Pulmonary:     Effort: Pulmonary effort is normal.     Breath sounds: Normal breath sounds.  Abdominal:     Palpations: Abdomen is soft.     Tenderness: There is no abdominal tenderness. There is no guarding or rebound.  Musculoskeletal:        General: Normal range of motion.     Cervical back: Normal range of motion.  Lymphadenopathy:     Cervical: No cervical adenopathy.  Neurological:     General: No focal deficit present.     Mental Status: She is alert and oriented to person, place, and time.     Cranial Nerves: No cranial nerve deficit.  Skin:    General: Skin is warm and dry.  Psychiatric:        Mood and Affect: Mood normal.        Behavior: Behavior normal.        Thought Content: Thought content normal.        Judgment: Judgment normal.  Vitals reviewed.     Assessment/Plan: Encounter for annual routine gynecological examination  Cervical cancer screening - Plan: Cytology - PAP  Screening  for HPV (human papillomavirus) - Plan: Cytology - PAP  Encounter for screening mammogram for malignant neoplasm of breast; pt has appt today  Perimenopause--f/u prn AUB. Discussed melatonin for sleep/SSRI prn moods. Pt to f/u prn.       GYN counsel breast self exam, mammography screening, adequate intake of calcium and vitamin D, diet and exercise     F/U  No follow-ups on file.  Vail Basista B. Alleyne Lac, PA-C 01/13/2022 8:05 PM

## 2022-01-14 ENCOUNTER — Ambulatory Visit
Admission: RE | Admit: 2022-01-14 | Discharge: 2022-01-14 | Disposition: A | Discharge status: Home / Self Care | Payer: BC Managed Care – PPO | Source: Ambulatory Visit | Attending: Obstetrics and Gynecology | Admitting: Obstetrics and Gynecology

## 2022-01-14 ENCOUNTER — Other Ambulatory Visit (HOSPITAL_COMMUNITY)
Admission: RE | Admit: 2022-01-14 | Discharge: 2022-01-14 | Disposition: A | Discharge status: Home / Self Care | Payer: BC Managed Care – PPO | Source: Ambulatory Visit | Attending: Obstetrics and Gynecology | Admitting: Obstetrics and Gynecology

## 2022-01-14 ENCOUNTER — Ambulatory Visit (INDEPENDENT_AMBULATORY_CARE_PROVIDER_SITE_OTHER): Payer: BC Managed Care – PPO | Admitting: Obstetrics and Gynecology

## 2022-01-14 ENCOUNTER — Encounter: Payer: Self-pay | Admitting: Obstetrics and Gynecology

## 2022-01-14 VITALS — BP 126/80 | Ht 60.0 in | Wt 143.0 lb

## 2022-01-14 DIAGNOSIS — Z124 Encounter for screening for malignant neoplasm of cervix: Secondary | ICD-10-CM | POA: Insufficient documentation

## 2022-01-14 DIAGNOSIS — N951 Menopausal and female climacteric states: Secondary | ICD-10-CM

## 2022-01-14 DIAGNOSIS — Z01419 Encounter for gynecological examination (general) (routine) without abnormal findings: Secondary | ICD-10-CM

## 2022-01-14 DIAGNOSIS — R87610 Atypical squamous cells of undetermined significance on cytologic smear of cervix (ASC-US): Secondary | ICD-10-CM | POA: Insufficient documentation

## 2022-01-14 DIAGNOSIS — Z1151 Encounter for screening for human papillomavirus (HPV): Secondary | ICD-10-CM | POA: Insufficient documentation

## 2022-01-14 DIAGNOSIS — G43839 Menstrual migraine, intractable, without status migrainosus: Secondary | ICD-10-CM

## 2022-01-14 DIAGNOSIS — Z1211 Encounter for screening for malignant neoplasm of colon: Secondary | ICD-10-CM

## 2022-01-14 DIAGNOSIS — Z1231 Encounter for screening mammogram for malignant neoplasm of breast: Secondary | ICD-10-CM | POA: Insufficient documentation

## 2022-01-14 MED ORDER — TRAMADOL HCL 50 MG PO TABS: 50.0000 mg | ORAL_TABLET | Freq: Four times a day (QID) | ORAL | 0 refills | Status: AC | PRN

## 2022-01-14 NOTE — Patient Instructions (Signed)
I value your feedback and you entrusting us with your care. If you get a [facility] patient survey, I would appreciate you taking the time to let us know about your experience today. Thank you! ? ? ?

## 2022-01-19 LAB — CYTOLOGY - PAP
Comment: NEGATIVE
Diagnosis: NEGATIVE
Diagnosis: REACTIVE
High risk HPV: NEGATIVE

## 2022-06-15 ENCOUNTER — Encounter: Payer: Self-pay | Admitting: Family

## 2022-06-22 ENCOUNTER — Ambulatory Visit (INDEPENDENT_AMBULATORY_CARE_PROVIDER_SITE_OTHER): Payer: BC Managed Care – PPO | Admitting: Family

## 2022-06-22 ENCOUNTER — Encounter: Payer: Self-pay | Admitting: Family

## 2022-06-22 VITALS — BP 122/64 | HR 84 | Temp 97.6°F | Ht 60.0 in | Wt 143.2 lb

## 2022-06-22 DIAGNOSIS — K5180 Other ulcerative colitis without complications: Secondary | ICD-10-CM | POA: Diagnosis not present

## 2022-06-22 DIAGNOSIS — M5431 Sciatica, right side: Secondary | ICD-10-CM | POA: Insufficient documentation

## 2022-06-22 DIAGNOSIS — M25561 Pain in right knee: Secondary | ICD-10-CM | POA: Diagnosis not present

## 2022-06-22 DIAGNOSIS — R252 Cramp and spasm: Secondary | ICD-10-CM

## 2022-06-22 DIAGNOSIS — I83811 Varicose veins of right lower extremities with pain: Secondary | ICD-10-CM | POA: Diagnosis not present

## 2022-06-22 DIAGNOSIS — M792 Neuralgia and neuritis, unspecified: Secondary | ICD-10-CM

## 2022-06-22 DIAGNOSIS — M2550 Pain in unspecified joint: Secondary | ICD-10-CM

## 2022-06-22 DIAGNOSIS — M79604 Pain in right leg: Secondary | ICD-10-CM

## 2022-06-22 DIAGNOSIS — Z8742 Personal history of other diseases of the female genital tract: Secondary | ICD-10-CM | POA: Insufficient documentation

## 2022-06-22 LAB — SEDIMENTATION RATE: Sed Rate: 19 mm/hr (ref 0–20)

## 2022-06-22 LAB — BASIC METABOLIC PANEL
BUN: 8 mg/dL (ref 6–23)
CO2: 30 mEq/L (ref 19–32)
Calcium: 9.7 mg/dL (ref 8.4–10.5)
Chloride: 102 mEq/L (ref 96–112)
Creatinine, Ser: 0.55 mg/dL (ref 0.40–1.20)
GFR: 107.49 mL/min (ref >60.00)
Glucose, Bld: 86 mg/dL (ref 70–99)
Potassium: 4 mEq/L (ref 3.5–5.1)
Sodium: 138 mEq/L (ref 135–145)

## 2022-06-22 LAB — CK: Total CK: 39 U/L (ref 7–177)

## 2022-06-22 LAB — C-REACTIVE PROTEIN: CRP: <1 mg/dL (ref 0.5–20.0)

## 2022-06-22 LAB — MAGNESIUM: Magnesium: 2 mg/dL (ref 1.5–2.5)

## 2022-06-22 LAB — VITAMIN B12: Vitamin B-12: 503 pg/mL (ref 211–911)

## 2022-06-22 MED ORDER — TIZANIDINE HCL 4 MG PO TABS
ORAL_TABLET | ORAL | 0 refills | Status: DC
Start: 2022-06-22 — End: 2023-03-01

## 2022-06-22 NOTE — Assessment & Plan Note (Signed)
Current flare seeing GI .

## 2022-06-22 NOTE — Assessment & Plan Note (Signed)
Referral placed for vascular surgeon for eval/treat

## 2022-06-22 NOTE — Assessment & Plan Note (Signed)
Worse today.  Advised massage, heat to site, stretching exercises (explained to pt in office)  Can use lidocaine patches prn and tylenol as needed.

## 2022-06-22 NOTE — Patient Instructions (Signed)
  Sent in ultrasound order for your right lower leg  You should get a call for this to schedule.   Muscle relaxer for as needed with nerve pain.   Stop by the lab prior to leaving today. I will notify you of your results once received.   A referral was placed today for vascular surgeon.  Please let us know if you have not heard back within 2 weeks about the referral.   Regards,   Marisella Puccio FNP-C

## 2022-06-22 NOTE — Assessment & Plan Note (Signed)
Posterior knee, suspected small bakers cyst Assess mass posterior mid thigh with venous doppler u/s  Lower leg pain, will assess as well with venous doppler, r/o blood clot

## 2022-06-22 NOTE — Progress Notes (Signed)
Established Patient Office Visit  Subjective:      CC:  Chief Complaint  Patient presents with   Back Pain    Patient fell years ago and have a knot on the lower right back side.    Leg Pain    Right leg pain that radiates in the front and thigh area that fells like cramps.    HPI: Ebony [name] is a 52 y.o. female presenting on 06/22/2022 for Back Pain (Patient fell years ago and have a knot on the lower right back side. ) and Leg Pain (Right leg pain that radiates in the front and thigh area that fells like cramps.) . States that over the last three months has noticed some tenderness and bulging on her posterior thigh varicose veins. She states she does have spider veins on her lower extremities. She does also state that she stands often, on concrete flooring. She described the pain as a dull discomfort and her leg felt heavy, then she noticed the pain was with some radiation to her inner thigh and then also to the right lower leg. She has noticed at night time she has noted some increasing leg cramps. Does hurt when she walks at times.   New complaints: She does have small skin spots on her right lower extremity as well, noticed these about four weeks ago  They do not itch and or hurt.   She does have psoriasis and cream from dermatology  She also notices improvement when she avoids gluten with her rash.   Also noticed polyathralgia specifically bil hands, if she holds onto something for a period of time she has trouble relaxing her hands because they feel so stiff.     Social history:  Relevant past medical, surgical, family and social history reviewed and updated as indicated. Interim medical history since our last visit reviewed.  Allergies and medications reviewed and updated.  DATA REVIEWED: CHART IN EPIC     ROS: Negative unless specifically indicated above in HPI.    Current Outpatient Medications:    augmented betamethasone dipropionate (DIPROLENE-AF)  0.05 % cream, APPLY ON RASH TWICE DAILY AS NEEDED FOR FLARES, Disp: , Rfl:    BIOTIN PO, Take 1 tablet by mouth daily., Disp: , Rfl:    budesonide (ENTOCORT EC) 3 MG 24 hr capsule, Take 3 capsules (9 mg total) by mouth daily., Disp: 90 capsule, Rfl: 4   cholecalciferol (VITAMIN D3) 25 MCG (1000 UNIT) tablet, Take 1,000 Units by mouth daily., Disp: , Rfl:    Flaxseed, Linseed, (FLAXSEED OIL PO), Take 1 tablet by mouth as needed., Disp: , Rfl:    mesalamine (LIALDA) 1.2 g EC tablet, Take 1 tablet (1.2 g total) by mouth QID., Disp: 120 tablet, Rfl: 11   mesalamine (ROWASA) 4 g enema, PLACE 60 MLS RECTALLY QHS, Disp: 1800 mL, Rfl: 3   tiZANidine (ZANAFLEX) 4 MG tablet, Take 1/2 to one tablet po qhs prn muscle spasm, Disp: 30 tablet, Rfl: 0   traMADol (ULTRAM) 50 MG tablet, Take 1 tablet (50 mg total) by mouth every 6 (six) hours as needed., Disp: 20 tablet, Rfl: 0  Current Facility-Administered Medications:    0.9 %  sodium chloride infusion, 500 mL, Intravenous, Once, Tressia Danas, MD      Objective:    BP 122/64 (BP Location: Left Arm)   Pulse 84   Temp 97.6 F (36.4 C) (Temporal)   Ht 5' (1.524 m)   Wt 143 lb 3.2 oz (65  kg)   SpO2 99%   BMI 27.97 kg/m   Wt Readings from Last 3 Encounters:  06/22/22 143 lb 3.2 oz (65 kg)  01/14/22 143 lb (64.9 kg)  11/23/21 140 lb 4 oz (63.6 kg)    Physical Exam Constitutional:      General: She is not in acute distress.    Appearance: Normal appearance. She is normal weight. She is not ill-appearing, toxic-appearing or diaphoretic.  HENT:     Head: Normocephalic.  Cardiovascular:     Rate and Rhythm: Normal rate.  Pulmonary:     Effort: Pulmonary effort is normal.  Musculoskeletal:        General: Normal range of motion.     Right upper leg: Swelling (tender mass mid posterior thigh) and tenderness present.     Right knee: Effusion (posterior knee mild tenderness) present.  Neurological:     General: No focal deficit present.      Mental Status: She is alert and oriented to person, place, and time. Mental status is at baseline.  Psychiatric:        Mood and Affect: Mood normal.        Behavior: Behavior normal.        Thought Content: Thought content normal.        Judgment: Judgment normal.    Right lower leg lateral side of ankle with three small lesions, flat, slightly pigmented darker than skin color, without scaling. Annular lesions.   Multiple areas of small capillaries and spider veins, and varicose veins. A few tender.             Assessment & Plan:  Neuralgia -     Vitamin B12  Muscle cramps -     Basic metabolic panel -     Magnesium -     tiZANidine HCl; Take 1/2 to one tablet po qhs prn muscle spasm  Dispense: 30 tablet; Refill: 0 -     C-reactive protein -     CK  Varicose veins of right lower extremity with pain Assessment & Plan: Referral placed for vascular surgeon for eval/treat  Orders: -     VAS [place] LOWER EXTREMITY VENOUS (DVT); Future -     Ambulatory referral to Vascular Surgery  Posterior right knee pain -     VAS [place] LOWER EXTREMITY VENOUS (DVT); Future  Pain of right lower extremity Assessment & Plan: Posterior knee, suspected small bakers cyst Assess mass posterior mid thigh with venous doppler u/s  Lower leg pain, will assess as well with venous doppler, r/o blood clot   Orders: -     VAS [place] LOWER EXTREMITY VENOUS (DVT); Future -     Ambulatory referral to Vascular Surgery  Right sided sciatica Assessment & Plan: Worse today.  Advised massage, heat to site, stretching exercises (explained to pt in office)  Can use lidocaine patches prn and tylenol as needed.    Polyarthralgia -     ANA -     Rheumatoid factor -     Sedimentation rate -     C-reactive protein  Other ulcerative colitis without complication (HCC) Assessment & Plan: Current flare seeing GI .   Orders: -     ANA -     Rheumatoid factor -     Sedimentation rate  History of abnormal  cervical Pap smear     Return if symptoms worsen or fail to improve.  Mort Sawyers, MSN, APRN, FNP-C [facility]

## 2022-06-24 LAB — ANTI-NUCLEAR AB-TITER (ANA TITER): ANA Titer 1: 1:40 {titer} — ABNORMAL HIGH

## 2022-06-24 LAB — RHEUMATOID FACTOR: Rheumatoid fact SerPl-aCnc: <10 IU/mL (ref <14)

## 2022-06-24 LAB — ANA: Anti Nuclear Antibody (ANA): POSITIVE — AB

## 2022-06-29 ENCOUNTER — Encounter: Payer: Self-pay | Admitting: Family

## 2022-06-30 NOTE — Telephone Encounter (Signed)
Placed a venous u/ to [facility] June 4th. Pt states has not received a call to schedule, can we look into this and or give pt a number to schedule? Thank you

## 2022-07-13 ENCOUNTER — Other Ambulatory Visit (INDEPENDENT_AMBULATORY_CARE_PROVIDER_SITE_OTHER): Payer: Self-pay | Admitting: Nurse Practitioner

## 2022-07-13 DIAGNOSIS — I83811 Varicose veins of right lower extremities with pain: Secondary | ICD-10-CM

## 2022-07-13 DIAGNOSIS — M79604 Pain in right leg: Secondary | ICD-10-CM

## 2022-07-21 ENCOUNTER — Ambulatory Visit (INDEPENDENT_AMBULATORY_CARE_PROVIDER_SITE_OTHER): Payer: BC Managed Care – PPO | Admitting: Nurse Practitioner

## 2022-07-21 ENCOUNTER — Encounter (INDEPENDENT_AMBULATORY_CARE_PROVIDER_SITE_OTHER): Payer: Self-pay | Admitting: Nurse Practitioner

## 2022-07-21 ENCOUNTER — Ambulatory Visit (INDEPENDENT_AMBULATORY_CARE_PROVIDER_SITE_OTHER): Payer: BC Managed Care – PPO

## 2022-07-21 VITALS — BP 127/81 | HR 76 | Resp 18 | Ht 60.0 in | Wt 141.2 lb

## 2022-07-21 DIAGNOSIS — M5431 Sciatica, right side: Secondary | ICD-10-CM | POA: Diagnosis not present

## 2022-07-21 DIAGNOSIS — I83811 Varicose veins of right lower extremities with pain: Secondary | ICD-10-CM | POA: Diagnosis not present

## 2022-07-21 DIAGNOSIS — M79604 Pain in right leg: Secondary | ICD-10-CM

## 2022-07-22 NOTE — Progress Notes (Signed)
Subjective:    Patient ID: Ebony [name], female    DOB: 1970/04/13, 52 y.o.   MRN: [id number] Chief Complaint  Patient presents with   New Patient (Initial Visit)    Np consult with reflux see FB ONLY Varicose veins of right lower extremity with pain    Ebony [name] is a 52 year old female that presents today for evaluation of leg pain and varicosities.  The patient notes that she has a history of standing and walking a lot due to her occupation.  And typically she has never had any issues until around the end of January.  She notes that her right lower leg began to get painful and feels like it was very heavy and fulfilling.  She notes that she felt something like a knot like sensation behind her knee as well.  She notes that she has been having pain shooting from the buttocks down the side of her leg and is also recently had some shooting in the inner thigh area.  She also has cramps in both of her legs 10 to cause her issues more at night.  She has pain constantly whether she is sitting or standing in the right leg.  She also notes that about a week ago the left knee also started to have some pain issues.  She notes that she has some notable varicosities in the right popliteal space.  There are some other scattered varicosities bilaterally.  Today she had noninvasive studies which shows no evidence of DVT or superficial phlebitis bilaterally.  No evidence of deep venous insufficiency bilaterally.  No evidence of superficial venous reflux bilaterally.  Additionally she has triphasic flow noted in the bilateral tibial vessels.  No cystic structures are noted in the popliteal fossa bilaterally    Review of Systems  Musculoskeletal:  Positive for gait problem and myalgias.       Objective:   Physical Exam Vitals reviewed.  HENT:     Head: Normocephalic.  Cardiovascular:     Rate and Rhythm: Normal rate.     Pulses: Normal pulses.  Pulmonary:     Effort: Pulmonary effort is  normal.  Skin:    General: Skin is warm and dry.  Neurological:     Mental Status: She is alert and oriented to person, place, and time.  Psychiatric:        Mood and Affect: Mood normal.        Behavior: Behavior normal.        Thought Content: Thought content normal.        Judgment: Judgment normal.     BP 127/81 (BP Location: Left Arm)   Pulse 76   Resp 18   Ht 5' (1.524 m)   Wt 141 lb 3.2 oz (64 kg)   BMI 27.58 kg/m   Past Medical History:  Diagnosis Date   Anemia    Arthritis    Blood transfusion without reported diagnosis unknown   for tx uc   Colon polyps 1997 or 1998?   GI bleeding    Ulcerative colitis (HCC)     Social History   Socioeconomic History   Marital status: Significant Other    Spouse name: Not on file   Number of children: 1   Years of education: High school   Highest education level: Not on file  Occupational History   Occupation: Investment banker, corporate: Self Employed  Tobacco Use   Smoking status: Never   Smokeless tobacco: Never  Vaping Use   Vaping Use: Never used  Substance and Sexual Activity   Alcohol use: Yes    Alcohol/week: 0.0 standard drinks of alcohol    Comment: Rarely-about 2 times a year if that   Drug use: No   Sexual activity: Yes    Partners: Male    Birth control/protection: Condom  Other Topics Concern   Not on file  Social History Narrative   11/16/18   From: the area   Living: son tyler and boyfriend Jillyn Hidden (15 yos)   Work: in Air[employer]      Family: Has son Joselyn Glassman (62 yo), boyfriend      Enjoys: cooking for other people, walking/hiking      Exercise: walking and weight training   Diet: small meals, only once per day      Safety   Seat belts: Yes    Guns: Yes  and secure   Safe in relationships: Yes    Social Determinants of Health   Financial Resource Strain: Low Risk  (11/16/2018)   Overall Financial Resource Strain (CARDIA)    Difficulty of Paying Living Expenses: Not hard at all  Food Insecurity: Not  on file  Transportation Needs: Not on file  Physical Activity: Not on file  Stress: Not on file  Social Connections: Not on file  Intimate Partner Violence: Not on file    Past Surgical History:  Procedure Laterality Date   BASAL CELL CARCINOMA EXCISION  2020   left breast   COLONOSCOPY  1997 or 1998?   HEMORRHOIDECTOMY WITH HEMORRHOID BANDING      Family History  Problem Relation Age of Onset   Diabetes Mother    Hypertension Mother    Dementia Mother        senile   Heart attack Mother 4   Diabetes Father    Heart disease Father    Hypertension Father    Hyperlipidemia Father    Heart attack Father 45   Other Father        heart bypass and heart transplant   Diabetes Brother    Heart disease Brother    Heart failure Brother    Heart attack Brother 66   Kidney disease Brother        dialysis   Heart disease Maternal Grandmother    Heart attack Maternal Grandmother    Other Paternal Grandmother        carotid blockage   Stroke Paternal Grandmother    Colon cancer Neg Hx    Colon polyps Neg Hx    Esophageal cancer Neg Hx    Gallbladder disease Neg Hx    Breast cancer Neg Hx     No Known Allergies     Latest Ref Rng & Units 11/24/2021    9:24 AM 10/29/2021   10:16 AM 11/20/2020   11:04 AM  CBC  WBC 4.0 - 10.5 K/uL 4.5  4.9  3.7   Hemoglobin 12.0 - 15.0 g/dL 16.1  09.6  04.5   Hematocrit 36.0 - 46.0 % 40.6  40.1  41.6   Platelets 150.0 - 400.0 K/uL 339.0  313.0  276.0       CMP     Component Value Date/Time   NA 138 06/22/2022 1142   NA 137 12/07/2017 1103   K 4.0 06/22/2022 1142   CL 102 06/22/2022 1142   CO2 30 06/22/2022 1142   GLUCOSE 86 06/22/2022 1142   BUN 8 06/22/2022 1142   BUN 6 12/07/2017 1103  CREATININE 0.55 06/22/2022 1142   CALCIUM 9.7 06/22/2022 1142   PROT 7.3 10/29/2021 1016   PROT 7.0 12/07/2017 1103   ALBUMIN 4.0 10/29/2021 1016   ALBUMIN 4.2 12/07/2017 1103   AST 16 10/29/2021 1016   ALT 13 10/29/2021 1016   ALKPHOS  52 10/29/2021 1016   BILITOT 0.4 10/29/2021 1016   BILITOT 0.2 12/07/2017 1103   GFR 107.49 06/22/2022 1142   GFRNONAA 115 12/07/2017 1103     No results found.     Assessment & Plan:   1. Varicose veins of right lower extremity with pain Today noninvasive studies indicate that the patient does not have any significant venous reflux bilaterally.  The varicosities that she currently has her spider varicosities.  She does not have any cystic structures or Baker's cyst noted bilaterally.  Her incidental studies also indicate that she has adequate arterial perfusion as well.  Based on this I do not feel that the source of her pain or discomfort is related to the noted varicosities.  I feel that it is likely due to sciatica or some other musculoskeletal condition.  We discussed treatment of superficial varicosities including sclerotherapy however currently the pain in her right leg is more concerning for her.  She is advised that if she begins to develop burning stinging aching or enlargement the varicosities we are certainly happy to reevaluate.  2. Right sided sciatica Based upon the patient's description of pain I suspect that this is the overwhelming cause of her discomfort.  I have discussed referral to either orthopedic surgery for evaluation for possible spine evaluation.  Will defer to primary care for further workup and referral.     Current Outpatient Medications on File Prior to Visit  Medication Sig Dispense Refill   augmented betamethasone dipropionate (DIPROLENE-AF) 0.05 % cream APPLY ON RASH TWICE DAILY AS NEEDED FOR FLARES     BIOTIN PO Take 1 tablet by mouth daily.     budesonide (ENTOCORT EC) 3 MG 24 hr capsule Take 3 capsules (9 mg total) by mouth daily. 90 capsule 4   cholecalciferol (VITAMIN D3) 25 MCG (1000 UNIT) tablet Take 1,000 Units by mouth daily.     Flaxseed, Linseed, (FLAXSEED OIL PO) Take 1 tablet by mouth as needed.     mesalamine (LIALDA) 1.2 g EC tablet  Take 1 tablet (1.2 g total) by mouth QID. 120 tablet 11   mesalamine (ROWASA) 4 g enema PLACE 60 MLS RECTALLY QHS 1800 mL 3   tiZANidine (ZANAFLEX) 4 MG tablet Take 1/2 to one tablet po qhs prn muscle spasm 30 tablet 0   traMADol (ULTRAM) 50 MG tablet Take 1 tablet (50 mg total) by mouth every 6 (six) hours as needed. 20 tablet 0   Current Facility-Administered Medications on File Prior to Visit  Medication Dose Route Frequency Provider Last Rate Last Admin   0.9 %  sodium chloride infusion  500 mL Intravenous Once Tressia Danas, MD        There are no Patient Instructions on file for this visit. No follow-ups on file.   Georgiana Spinner, NP

## 2022-08-05 ENCOUNTER — Encounter (INDEPENDENT_AMBULATORY_CARE_PROVIDER_SITE_OTHER): Payer: BC Managed Care – PPO | Admitting: Nurse Practitioner

## 2022-08-05 ENCOUNTER — Encounter (INDEPENDENT_AMBULATORY_CARE_PROVIDER_SITE_OTHER): Payer: BC Managed Care – PPO

## 2022-10-11 ENCOUNTER — Other Ambulatory Visit: Payer: Self-pay | Admitting: Obstetrics and Gynecology

## 2022-10-11 DIAGNOSIS — Z1231 Encounter for screening mammogram for malignant neoplasm of breast: Secondary | ICD-10-CM

## 2022-11-04 DIAGNOSIS — Z85828 Personal history of other malignant neoplasm of skin: Secondary | ICD-10-CM | POA: Diagnosis not present

## 2022-11-04 DIAGNOSIS — D2271 Melanocytic nevi of right lower limb, including hip: Secondary | ICD-10-CM | POA: Diagnosis not present

## 2022-11-04 DIAGNOSIS — L821 Other seborrheic keratosis: Secondary | ICD-10-CM | POA: Diagnosis not present

## 2022-11-04 DIAGNOSIS — D2272 Melanocytic nevi of left lower limb, including hip: Secondary | ICD-10-CM | POA: Diagnosis not present

## 2022-11-17 ENCOUNTER — Other Ambulatory Visit: Payer: Self-pay | Admitting: Physician Assistant

## 2022-12-08 ENCOUNTER — Ambulatory Visit: Payer: BC Managed Care – PPO | Admitting: Family

## 2022-12-08 ENCOUNTER — Encounter: Payer: Self-pay | Admitting: Family

## 2022-12-08 ENCOUNTER — Ambulatory Visit (INDEPENDENT_AMBULATORY_CARE_PROVIDER_SITE_OTHER)
Admission: RE | Admit: 2022-12-08 | Discharge: 2022-12-08 | Disposition: A | Discharge status: Home / Self Care | Payer: BC Managed Care – PPO | Source: Ambulatory Visit | Attending: Family | Admitting: Family

## 2022-12-08 VITALS — BP 124/80 | HR 81 | Temp 98.0°F | Ht 60.0 in | Wt 143.0 lb

## 2022-12-08 DIAGNOSIS — M5441 Lumbago with sciatica, right side: Secondary | ICD-10-CM

## 2022-12-08 DIAGNOSIS — G8929 Other chronic pain: Secondary | ICD-10-CM

## 2022-12-08 DIAGNOSIS — M549 Dorsalgia, unspecified: Secondary | ICD-10-CM | POA: Diagnosis not present

## 2022-12-08 DIAGNOSIS — K5180 Other ulcerative colitis without complications: Secondary | ICD-10-CM

## 2022-12-08 DIAGNOSIS — Z8342 Family history of familial hypercholesterolemia: Secondary | ICD-10-CM

## 2022-12-08 DIAGNOSIS — G479 Sleep disorder, unspecified: Secondary | ICD-10-CM

## 2022-12-08 DIAGNOSIS — R195 Other fecal abnormalities: Secondary | ICD-10-CM | POA: Diagnosis not present

## 2022-12-08 DIAGNOSIS — Z23 Encounter for immunization: Secondary | ICD-10-CM | POA: Diagnosis not present

## 2022-12-08 DIAGNOSIS — Z13220 Encounter for screening for lipoid disorders: Secondary | ICD-10-CM

## 2022-12-08 DIAGNOSIS — M5430 Sciatica, unspecified side: Secondary | ICD-10-CM | POA: Diagnosis not present

## 2022-12-08 DIAGNOSIS — Z0000 Encounter for general adult medical examination without abnormal findings: Secondary | ICD-10-CM | POA: Diagnosis not present

## 2022-12-08 DIAGNOSIS — I8393 Asymptomatic varicose veins of bilateral lower extremities: Secondary | ICD-10-CM

## 2022-12-08 LAB — LIPID PANEL
Cholesterol: 225 mg/dL — ABNORMAL HIGH (ref 0–200)
HDL: 65.2 mg/dL (ref >39.00)
LDL Cholesterol: 136 mg/dL — ABNORMAL HIGH (ref 0–99)
NonHDL: 159.77
Total CHOL/HDL Ratio: 3
Triglycerides: 118 mg/dL (ref 0.0–149.0)
VLDL: 23.6 mg/dL (ref 0.0–40.0)

## 2022-12-08 LAB — CBC
HCT: 42 % (ref 36.0–46.0)
Hemoglobin: 13.7 g/dL (ref 12.0–15.0)
MCHC: 32.6 g/dL (ref 30.0–36.0)
MCV: 88.7 fL (ref 78.0–100.0)
Platelets: 281 10*3/uL (ref 150.0–400.0)
RBC: 4.74 Mil/uL (ref 3.87–5.11)
RDW: 14.8 % (ref 11.5–15.5)
WBC: 4.3 10*3/uL (ref 4.0–10.5)

## 2022-12-08 LAB — TSH: TSH: 5.91 u[IU]/mL — ABNORMAL HIGH (ref 0.35–5.50)

## 2022-12-08 MED ORDER — TRAZODONE HCL 50 MG PO TABS: 25.0000 mg | ORAL_TABLET | Freq: Every evening | ORAL | 3 refills | Status: DC | PRN

## 2022-12-08 NOTE — Assessment & Plan Note (Signed)

## 2022-12-08 NOTE — Patient Instructions (Addendum)
  Call GI to get back on their schedule let me know if you run into any issues with finding a female provider.    Stop by the lab prior to leaving today. I will notify you of your results once received.   Recommendations on keeping yourself healthy:  - Exercise at least 30-45 minutes a day, 3-4 days a week.  - Eat a low-fat diet with lots of fruits and vegetables, up to 7-9 servings per day.  - Seatbelts can save your life. Wear them always.  - Smoke detectors on every level of your home, check batteries every year.  - Eye Doctor - have an eye exam every 1-2 years  - Safe sex - if you may be exposed to STDs, use a condom.  - Alcohol -  If you drink, do it moderately, less than 2 drinks per day.  - Health Care Power of Attorney. Choose someone to speak for you if you are not able.  - Depression is common in our stressful world.If you're feeling down or losing interest in things you normally enjoy, please come in for a visit.  - Violence - If anyone is threatening or hurting you, please call immediately.  Due to recent changes in healthcare laws, you may see results of your imaging and/or laboratory studies on MyChart before I have had a chance to review them.  I understand that in some cases there may be results that are confusing or concerning to you. Please understand that not all results are received at the same time and often I may need to interpret multiple results in order to provide you with the best plan of care or course of treatment. Therefore, I ask that you please give me 2 business days to thoroughly review all your results before contacting my office for clarification. Should we see a critical lab result, you will be contacted sooner.   I will see you again in one year for your annual comprehensive exam unless otherwise stated and or with acute concerns.  It was a pleasure seeing you today! Please do not hesitate to reach out with any questions and or concerns.  Regards,    Mort Sawyers

## 2022-12-08 NOTE — Progress Notes (Unsigned)
Subjective:  Patient ID: Ebony [name], female    DOB: 1970-08-07  Age: 52 y.o. MRN: [id number]  Patient Care Team: Mort Sawyers, FNP as PCP - General (Family Medicine) Tressia Danas, MD (Inactive) as Consulting Physician (Gastroenterology)   CC:  Chief Complaint  Patient presents with   Annual Exam    HPI Ebony [name] is a 52 y.o. female who presents today for an annual physical exam. She reports consuming a general diet.  Walking routine and some weights  She generally feels well. She reports sleeping poorly. She does have additional problems to discuss today.  Able to fall asleep but can not seem to stay asleep. Has bought melatonin but has not yet tried it, she has tried Zquill but makes her over tired.  She works on sleep hygiene which helps slightly.   Vision:Not within last year Dental:Receives regular dental care  Mammogram: 01/14/22, has scheduled 01/17/23  Last pap: 01/07/21 ASCUS  Colonoscopy: Bone density scan:  Pt is with acute concerns.   Right lower extremity with pain, recently saw vascular for varicose veins was evaluated and suspected to be right sided sciatica.  Per note, they did not feel they needed to proceed with sclerotherapy at this time. She does feel that she does have right lower back pain with radiation of pain at times down the posterior leg. She does feel tenderness at times in the right lower buttock.  Many years ago she did lift something and felt a 'snag' in her lower back and then maybe one other time after that she bent over and felt a sharp jab to go back up but not since. Has tried tizanidine with only mild improvement. One week ago was hard to sit up straight, and if she tried to pick anything up the pain was worse.    Advanced Directives Patient does not have advanced directives   DEPRESSION SCREENING    12/08/2022    8:33 AM 06/22/2022   10:48 AM 11/23/2021   11:40 AM 11/20/2020   11:25 AM 11/19/2019    9:25 AM  11/16/2018    9:51 AM  PHQ 2/9 Scores  PHQ - 2 Score 0 0 0 2 0 0  PHQ- 9 Score 2   6  3      ROS: Negative unless specifically indicated above in HPI.    Current Outpatient Medications:    augmented betamethasone dipropionate (DIPROLENE-AF) 0.05 % cream, APPLY ON RASH TWICE DAILY AS NEEDED FOR FLARES, Disp: , Rfl:    BIOTIN PO, Take 1 tablet by mouth daily., Disp: , Rfl:    budesonide (ENTOCORT EC) 3 MG 24 hr capsule, Take 3 capsules (9 mg total) by mouth daily., Disp: 90 capsule, Rfl: 4   cholecalciferol (VITAMIN D3) 25 MCG (1000 UNIT) tablet, Take 1,000 Units by mouth daily., Disp: , Rfl:    Flaxseed, Linseed, (FLAXSEED OIL PO), Take 1 tablet by mouth as needed., Disp: , Rfl:    mesalamine (LIALDA) 1.2 g EC tablet, Take 1 tablet by mouth 4 times daily, Disp: 120 tablet, Rfl: 0   mesalamine (ROWASA) 4 g enema, PLACE 60 MLS RECTALLY QHS, Disp: 1800 mL, Rfl: 3   tiZANidine (ZANAFLEX) 4 MG tablet, Take 1/2 to one tablet po qhs prn muscle spasm, Disp: 30 tablet, Rfl: 0   traMADol (ULTRAM) 50 MG tablet, Take 1 tablet (50 mg total) by mouth every 6 (six) hours as needed., Disp: 20 tablet, Rfl: 0   traZODone (DESYREL) 50 MG tablet, Take  0.5-1 tablets (25-50 mg total) by mouth at bedtime as needed for sleep., Disp: 30 tablet, Rfl: 3  Current Facility-Administered Medications:    0.9 %  sodium chloride infusion, 500 mL, Intravenous, Once, Tressia Danas, MD    Objective:    BP 124/80 (BP Location: Left Arm, Patient Position: Sitting, Cuff Size: Normal)   Pulse 81   Temp 98 F (36.7 C) (Temporal)   Ht 5' (1.524 m)   Wt 143 lb (64.9 kg)   SpO2 98%   BMI 27.93 kg/m   BP Readings from Last 3 Encounters:  12/08/22 124/80  07/21/22 127/81  06/22/22 122/64      Physical Exam Constitutional:      General: She is not in acute distress.    Appearance: Normal appearance. She is normal weight. She is not ill-appearing.  HENT:     Head: Normocephalic.     Right Ear: Tympanic membrane  normal.     Left Ear: Tympanic membrane normal.     Nose: Nose normal.     Mouth/Throat:     Mouth: Mucous membranes are moist.  Eyes:     Extraocular Movements: Extraocular movements intact.     Pupils: Pupils are equal, round, and reactive to light.  Cardiovascular:     Rate and Rhythm: Normal rate and regular rhythm.  Pulmonary:     Effort: Pulmonary effort is normal.     Breath sounds: Normal breath sounds.  Abdominal:     General: Abdomen is flat. Bowel sounds are normal.     Palpations: Abdomen is soft.     Tenderness: There is no guarding or rebound.  Musculoskeletal:        General: Normal range of motion.     Cervical back: Normal range of motion.  Skin:    General: Skin is warm.     Capillary Refill: Capillary refill takes less than 2 seconds.  Neurological:     General: No focal deficit present.     Mental Status: She is alert.  Psychiatric:        Mood and Affect: Mood normal.        Behavior: Behavior normal.        Thought Content: Thought content normal.        Judgment: Judgment normal.          Assessment & Plan:  Influenza vaccine administered -     Flu vaccine trivalent PF, 6mos and older(Flulaval,Afluria,Fluarix,Fluzone)  Elevated fecal calprotectin Assessment & Plan: Repeat again today  Pt overdue for GI, she knows she needs to f/u especially as she is symptomatic  She states she will call to make appt  Orders: -     CALPROTECTIN; Future  Encounter for general adult medical examination without abnormal findings Assessment & Plan: Patient Counseling(The following topics were reviewed):  Preventative care handout given to pt  Health maintenance and immunizations reviewed. Please refer to Health maintenance section. Pt advised on safe sex, wearing seatbelts in car, and proper nutrition labwork ordered today for annual Dental health: Discussed importance of regular tooth brushing, flossing, and dental visits.   Orders: -     Lipid  panel -     CBC -     TSH  Screening for lipoid disorders -     Lipid panel -     Lipoprotein A (LPA)  Chronic right-sided low back pain with right-sided sciatica Assessment & Plan: Acute on chronic  Xray lumbar spine today to r/o acute findings.  Suspected arthritis with flare.  Heat to site, alternate with ice as symptom improvement.  Tylenol prn lidocaine patches prn   Orders: -     DG Lumbar Spine Complete; Future  Sleep disorder Assessment & Plan: Trial trazodone Discussed good sleep hygiene  Orders: -     traZODone HCl; Take 0.5-1 tablets (25-50 mg total) by mouth at bedtime as needed for sleep.  Dispense: 30 tablet; Refill: 3  Other ulcerative colitis without complication (HCC) -     CALPROTECTIN; Future  Family history of high cholesterol -     Lipoprotein A (LPA)  Asymptomatic varicose veins of both lower extremities  e   Follow-up: Return in about 1 year (around 12/08/2023) for f/u CPE.   Mort Sawyers, FNP

## 2022-12-09 ENCOUNTER — Other Ambulatory Visit: Payer: Self-pay | Admitting: Family

## 2022-12-09 DIAGNOSIS — I8393 Asymptomatic varicose veins of bilateral lower extremities: Secondary | ICD-10-CM | POA: Insufficient documentation

## 2022-12-09 DIAGNOSIS — E039 Hypothyroidism, unspecified: Secondary | ICD-10-CM

## 2022-12-09 DIAGNOSIS — Z8342 Family history of familial hypercholesterolemia: Secondary | ICD-10-CM | POA: Insufficient documentation

## 2022-12-09 DIAGNOSIS — G479 Sleep disorder, unspecified: Secondary | ICD-10-CM | POA: Insufficient documentation

## 2022-12-09 NOTE — Assessment & Plan Note (Signed)
Repeat again today  Pt overdue for GI, she knows she needs to f/u especially as she is symptomatic  She states she will call to make appt

## 2022-12-09 NOTE — Assessment & Plan Note (Signed)
Trial trazodone Discussed good sleep hygiene

## 2022-12-09 NOTE — Assessment & Plan Note (Signed)
Acute on chronic  Xray lumbar spine today to r/o acute findings.  Suspected arthritis with flare.  Heat to site, alternate with ice as symptom improvement.  Tylenol prn lidocaine patches prn

## 2022-12-10 ENCOUNTER — Encounter: Payer: Self-pay | Admitting: Family

## 2022-12-10 ENCOUNTER — Ambulatory Visit (INDEPENDENT_AMBULATORY_CARE_PROVIDER_SITE_OTHER): Payer: BC Managed Care – PPO

## 2022-12-10 ENCOUNTER — Other Ambulatory Visit: Payer: Self-pay | Admitting: Family

## 2022-12-10 DIAGNOSIS — E039 Hypothyroidism, unspecified: Secondary | ICD-10-CM

## 2022-12-10 LAB — LIPOPROTEIN A (LPA): Lipoprotein (a): 32 nmol/L (ref <75)

## 2022-12-10 LAB — T4, FREE: Free T4: 0.7 ng/dL (ref 0.60–1.60)

## 2022-12-10 LAB — T3, FREE: T3, Free: 3.6 pg/mL (ref 2.3–4.2)

## 2022-12-10 MED ORDER — LEVOTHYROXINE SODIUM 50 MCG PO TABS: 50.0000 ug | ORAL_TABLET | Freq: Every day | ORAL | 3 refills | Status: DC

## 2022-12-10 NOTE — Progress Notes (Signed)
Cna we add on thyroid panel? Added it to future orders

## 2022-12-13 ENCOUNTER — Encounter: Payer: Self-pay | Admitting: Family

## 2022-12-13 ENCOUNTER — Other Ambulatory Visit: Payer: Self-pay | Admitting: Physician Assistant

## 2022-12-13 LAB — THYROID PEROXIDASE ANTIBODIES (TPO) (REFL): Thyroperoxidase Ab SerPl-aCnc: 6 [IU]/mL (ref <9)

## 2022-12-19 NOTE — Telephone Encounter (Signed)
Can we try to schedule a video visit for this pt so we can discuss all Q in detail?

## 2022-12-30 ENCOUNTER — Other Ambulatory Visit: Payer: Self-pay

## 2022-12-30 DIAGNOSIS — K5180 Other ulcerative colitis without complications: Secondary | ICD-10-CM

## 2022-12-30 DIAGNOSIS — R195 Other fecal abnormalities: Secondary | ICD-10-CM | POA: Diagnosis not present

## 2023-01-07 LAB — CALPROTECTIN: Calprotectin: 21 ug/g

## 2023-01-13 NOTE — Progress Notes (Signed)
PCP:  Mort Sawyers, FNP   Chief Complaint  Patient presents with   Gynecologic Exam    No concerns     HPI:      [name] is a 52 y.o. G0P0000 who LMP was Patient's last menstrual period was 01/18/2022 (approximate)., presents today for her annual examination.  Her menses are infrequent due to perimenopause (LMP 1/24), few days spotting only. No dysmen, no BTB. Elevated TSH 11/24, started on levo. Was having more significant VS sx this summer and prior to starting levo; improved now. Rarely has migraines now (used to be more with periods), no need for tramadol RF.   Sex activity: not sex active, no vag sx.  Last Pap: 01/14/22  Results were: NILM/neg HPV DNA  Hx of STDs: none  Last mammogram: 01/14/22  Results were: normal--routine follow-up in 12 months. Has appt 12/2022 There is no FH of breast cancer. There is no FH of ovarian cancer. The patient does self-breast exams.  Tobacco use: The patient denies current or previous tobacco use. Alcohol use: none No drug use.  Exercise: mod active  She does get adequate calcium and Vitamin D in her diet. Labs with PCP. Strong FH CAD on both sides.   Pt with hx of ulcerative colitis. EGD and Colonoscopy 2021, unsure when due next but thinks 2024. Seeing Dr. Orvan Falconer at [facility]. Needs to schedule.    Past Medical History:  Diagnosis Date   Anemia    Arthritis    Blood transfusion without reported diagnosis unknown   for tx uc   Colon polyps 1997 or 1998?   GI bleeding    Ulcerative colitis [facility])     Past Surgical History:  Procedure Laterality Date   BASAL CELL CARCINOMA EXCISION  2020   left breast   COLONOSCOPY  1997 or 1998?   HEMORRHOIDECTOMY WITH HEMORRHOID BANDING      Family History  Problem Relation Age of Onset   Diabetes Mother    Hypertension Mother    Dementia Mother        senile   Heart attack Mother 26   Diabetes Father    Heart disease Father    Hypertension Father     Hyperlipidemia Father    Heart attack Father 24   Other Father        heart bypass and heart transplant   Diabetes Brother    Heart disease Brother    Heart failure Brother    Heart attack Brother 49   Kidney disease Brother        dialysis   Heart disease Maternal Grandmother    Heart attack Maternal Grandmother    Other Paternal Grandmother        carotid blockage   Stroke Paternal Grandmother    Colon cancer Neg Hx    Colon polyps Neg Hx    Esophageal cancer Neg Hx    Gallbladder disease Neg Hx    Breast cancer Neg Hx     Social History   Socioeconomic History   Marital status: Significant Other    Spouse name: Not on file   Number of children: 1   Years of education: High school   Highest education level: Not on file  Occupational History   Occupation: Investment banker, corporate: Self Employed  Tobacco Use   Smoking status: Never   Smokeless tobacco: Never  Vaping Use   Vaping status: Never Used  Substance and Sexual Activity  Alcohol use: Yes    Alcohol/week: 0.0 standard drinks of alcohol    Comment: Rarely-about 2 times a year if that   Drug use: No   Sexual activity: Not Currently    Partners: Male    Birth control/protection: None  Other Topics Concern   Not on file  Social History Narrative   11/16/18   From: the area   Living: son tyler and boyfriend Jillyn Hidden (15 yos)   Work: in Air[employer]      Family: Has son Joselyn Glassman (32 yo), boyfriend      Enjoys: cooking for other people, walking/hiking      Exercise: walking and weight training   Diet: small meals, only once per day      Safety   Seat belts: Yes    Guns: Yes  and secure   Safe in relationships: Yes    Social Drivers of Health   Financial Resource Strain: Low Risk  (11/16/2018)   Overall Financial Resource Strain (CARDIA)    Difficulty of Paying Living Expenses: Not hard at all  Food Insecurity: Not on file  Transportation Needs: Not on file  Physical Activity: Not on file  Stress: Not on file   Social Connections: Not on file  Intimate Partner Violence: Not on file    Outpatient Medications Prior to Visit  Medication Sig Dispense Refill   augmented betamethasone dipropionate (DIPROLENE-AF) 0.05 % cream APPLY ON RASH TWICE DAILY AS NEEDED FOR FLARES     BIOTIN PO Take 1 tablet by mouth daily.     budesonide (ENTOCORT EC) 3 MG 24 hr capsule Take 3 capsules (9 mg total) by mouth daily. 90 capsule 4   cholecalciferol (VITAMIN D3) 25 MCG (1000 UNIT) tablet Take 1,000 Units by mouth daily.     Flaxseed, Linseed, (FLAXSEED OIL PO) Take 1 tablet by mouth as needed.     levothyroxine (SYNTHROID) 50 MCG tablet Take 1 tablet (50 mcg total) by mouth daily. 90 tablet 3   mesalamine (LIALDA) 1.2 g EC tablet Take 1 tablet by mouth 4 times daily 120 tablet 0   mesalamine (ROWASA) 4 g enema PLACE 60 MLS RECTALLY QHS 1800 mL 3   tiZANidine (ZANAFLEX) 4 MG tablet Take 1/2 to one tablet po qhs prn muscle spasm 30 tablet 0   traMADol (ULTRAM) 50 MG tablet Take 1 tablet (50 mg total) by mouth every 6 (six) hours as needed. 20 tablet 0   traZODone (DESYREL) 50 MG tablet Take 0.5-1 tablets (25-50 mg total) by mouth at bedtime as needed for sleep. 30 tablet 3   0.9 %  sodium chloride infusion      No facility-administered medications prior to visit.      ROS:  Review of Systems  Constitutional:  Negative for fatigue, fever and unexpected weight change.  Respiratory:  Negative for cough, shortness of breath and wheezing.   Cardiovascular:  Negative for chest pain, palpitations and leg swelling.  Gastrointestinal:  Negative for blood in stool, constipation, diarrhea, nausea and vomiting.  Endocrine: Negative for cold intolerance, heat intolerance and polyuria.  Genitourinary:  Negative for dyspareunia, dysuria, flank pain, frequency, genital sores, hematuria, menstrual problem, pelvic pain, urgency, vaginal bleeding, vaginal discharge and vaginal pain.  Musculoskeletal:  Negative for back pain,  joint swelling and myalgias.  Skin:  Negative for rash.  Neurological:  Negative for dizziness, syncope, light-headedness, numbness and headaches.  Hematological:  Negative for adenopathy.  Psychiatric/Behavioral:  Negative for agitation, confusion, sleep disturbance and suicidal ideas.  The patient is not nervous/anxious.   BREAST: No symptoms   Objective: BP 110/60   Ht 5' (1.524 m)   Wt 140 lb (63.5 kg)   LMP 01/18/2022 (Approximate)   BMI 27.34 kg/m    Physical Exam Constitutional:      Appearance: She is well-developed.  Genitourinary:     Vulva normal.     Right Labia: No rash, tenderness or lesions.    Left Labia: No tenderness, lesions or rash.    No vaginal discharge, erythema or tenderness.      Right Adnexa: not tender and no mass present.    Left Adnexa: not tender and no mass present.    No cervical friability or polyp.     Uterus is not enlarged or tender.  Breasts:    Right: No mass, nipple discharge, skin change or tenderness.     Left: No mass, nipple discharge, skin change or tenderness.  Neck:     Thyroid: No thyromegaly.  Cardiovascular:     Rate and Rhythm: Normal rate and regular rhythm.     Heart sounds: Normal heart sounds. No murmur heard. Pulmonary:     Effort: Pulmonary effort is normal.     Breath sounds: Normal breath sounds.  Abdominal:     Palpations: Abdomen is soft.     Tenderness: There is no abdominal tenderness. There is no guarding or rebound.  Musculoskeletal:        General: Normal range of motion.     Cervical back: Normal range of motion.  Lymphadenopathy:     Cervical: No cervical adenopathy.  Neurological:     General: No focal deficit present.     Mental Status: She is alert and oriented to person, place, and time.     Cranial Nerves: No cranial nerve deficit.  Skin:    General: Skin is warm and dry.  Psychiatric:        Mood and Affect: Mood normal.        Behavior: Behavior normal.        Thought Content: Thought  content normal.        Judgment: Judgment normal.  Vitals reviewed.     Assessment/Plan: Encounter for annual routine gynecological examination  Encounter for screening mammogram for malignant neoplasm of breast; pt has appt  Screening for colon cancer--pt will schedule. Will call for ref prn. Wants female provider.   Perimenopause--1/25 should be 1 yr for menopause status. F/u prn AUB     GYN counsel breast self exam, mammography screening, adequate intake of calcium and vitamin D, diet and exercise     F/U  Return in about 1 year (around 01/14/2024).  Irmalee Riemenschneider B. Lizvette Lightsey, PA-C 01/14/2023 11:29 AM

## 2023-01-14 ENCOUNTER — Ambulatory Visit (INDEPENDENT_AMBULATORY_CARE_PROVIDER_SITE_OTHER): Payer: BC Managed Care – PPO | Admitting: Obstetrics and Gynecology

## 2023-01-14 ENCOUNTER — Encounter: Payer: Self-pay | Admitting: Obstetrics and Gynecology

## 2023-01-14 VITALS — BP 110/60 | Ht 60.0 in | Wt 140.0 lb

## 2023-01-14 DIAGNOSIS — N951 Menopausal and female climacteric states: Secondary | ICD-10-CM

## 2023-01-14 DIAGNOSIS — Z1231 Encounter for screening mammogram for malignant neoplasm of breast: Secondary | ICD-10-CM

## 2023-01-14 DIAGNOSIS — Z01419 Encounter for gynecological examination (general) (routine) without abnormal findings: Secondary | ICD-10-CM | POA: Diagnosis not present

## 2023-01-14 DIAGNOSIS — Z1211 Encounter for screening for malignant neoplasm of colon: Secondary | ICD-10-CM

## 2023-01-14 NOTE — Patient Instructions (Signed)
I value your feedback and you entrusting us with your care. If you get a [facility] patient survey, I would appreciate you taking the time to let us know about your experience today. Thank you! ? ? ?

## 2023-01-17 ENCOUNTER — Ambulatory Visit
Admission: RE | Admit: 2023-01-17 | Discharge: 2023-01-17 | Disposition: A | Discharge status: Home / Self Care | Payer: BC Managed Care – PPO | Source: Ambulatory Visit | Attending: Obstetrics and Gynecology | Admitting: Obstetrics and Gynecology

## 2023-01-17 DIAGNOSIS — Z1231 Encounter for screening mammogram for malignant neoplasm of breast: Secondary | ICD-10-CM | POA: Diagnosis not present

## 2023-02-03 ENCOUNTER — Other Ambulatory Visit: Payer: Self-pay | Admitting: Physician Assistant

## 2023-02-07 ENCOUNTER — Encounter: Payer: Self-pay | Admitting: Family

## 2023-02-07 ENCOUNTER — Ambulatory Visit (INDEPENDENT_AMBULATORY_CARE_PROVIDER_SITE_OTHER): Payer: BC Managed Care – PPO | Admitting: Family

## 2023-02-07 VITALS — BP 114/72 | HR 105 | Temp 98.4°F | Ht 60.0 in | Wt 136.2 lb

## 2023-02-07 DIAGNOSIS — J0140 Acute pansinusitis, unspecified: Secondary | ICD-10-CM | POA: Diagnosis not present

## 2023-02-07 MED ORDER — PREDNISONE 10 MG (21) PO TBPK: ORAL_TABLET | ORAL | 0 refills | Status: DC

## 2023-02-07 MED ORDER — AMOXICILLIN-POT CLAVULANATE 875-125 MG PO TABS: 1.0000 | ORAL_TABLET | Freq: Two times a day (BID) | ORAL | 0 refills | Status: DC

## 2023-02-07 NOTE — Progress Notes (Signed)
Established Patient Office Visit  Subjective:   Patient ID: Ebony [name], female    DOB: 1970/08/04  Age: 53 y.o. MRN: [id number]  CC:  Chief Complaint  Patient presents with   Acute Visit    Reports issues with sinus congestion, sinus pressure, slight cough x4 weeks. Has been taking Tylenol for pain.    HPI: Ebony [name] is a 53 y.o. female presenting on 02/07/2023 for Acute Visit (Reports issues with sinus congestion, sinus pressure, slight cough x4 weeks. Has been taking Tylenol for pain.)  Over the last four weeks with sinus congestion, pressure and a cough. She has been blowing her nose a lot. She has hoarseness. Started to get better very slightly but then came back. Night and am is 'even worse' hard to breath with nasal congestion. The cough is productive, with walking can feel a bit winded. Did have a fever beginning of symptoms but not in a few weeks  Tylenol as needed when really tender. She does not take anything regularly for allergies.        ROS: Negative unless specifically indicated above in HPI.   Relevant past medical history reviewed and updated as indicated.   Allergies and medications reviewed and updated.   Current Outpatient Medications:    amoxicillin-clavulanate (AUGMENTIN) 875-125 MG tablet, Take 1 tablet by mouth 2 (two) times daily., Disp: 20 tablet, Rfl: 0   augmented betamethasone dipropionate (DIPROLENE-AF) 0.05 % cream, APPLY ON RASH TWICE DAILY AS NEEDED FOR FLARES, Disp: , Rfl:    BIOTIN PO, Take 1 tablet by mouth daily., Disp: , Rfl:    budesonide (ENTOCORT EC) 3 MG 24 hr capsule, Take 3 capsules (9 mg total) by mouth daily., Disp: 90 capsule, Rfl: 4   cholecalciferol (VITAMIN D3) 25 MCG (1000 UNIT) tablet, Take 1,000 Units by mouth daily., Disp: , Rfl:    Flaxseed, Linseed, (FLAXSEED OIL PO), Take 1 tablet by mouth as needed., Disp: , Rfl:    levothyroxine (SYNTHROID) 50 MCG tablet, Take 1 tablet (50 mcg total) by mouth daily.,  Disp: 90 tablet, Rfl: 3   mesalamine (LIALDA) 1.2 g EC tablet, TAKE 1 TABLET BY MOUTH 4 TIMES DAILY (NEEDS  APPOINTMENT  FOR  FUTURE  REFILLS) (CALL [phone] TO SCHEDULE AN APPOINTMENT), Disp: 120 tablet, Rfl: 0   mesalamine (ROWASA) 4 g enema, PLACE 60 MLS RECTALLY QHS, Disp: 1800 mL, Rfl: 3   predniSONE (STERAPRED UNI-PAK 21 TAB) 10 MG (21) TBPK tablet, Take as directed, Disp: 1 each, Rfl: 0   tiZANidine (ZANAFLEX) 4 MG tablet, Take 1/2 to one tablet po qhs prn muscle spasm, Disp: 30 tablet, Rfl: 0   traMADol (ULTRAM) 50 MG tablet, Take 1 tablet (50 mg total) by mouth every 6 (six) hours as needed., Disp: 20 tablet, Rfl: 0   traZODone (DESYREL) 50 MG tablet, Take 0.5-1 tablets (25-50 mg total) by mouth at bedtime as needed for sleep., Disp: 30 tablet, Rfl: 3  No Known Allergies  Objective:   BP 114/72 (BP Location: Left Arm, Patient Position: Sitting, Cuff Size: Normal)   Pulse (!) 105   Temp 98.4 F (36.9 C) (Temporal)   Ht 5' (1.524 m)   Wt 136 lb 3.2 oz (61.8 kg)   LMP 01/18/2022 (Approximate)   SpO2 97%   BMI 26.60 kg/m    Physical Exam Constitutional:      General: She is not in acute distress.    Appearance: Normal appearance. She is normal weight. She is not  ill-appearing, toxic-appearing or diaphoretic.  HENT:     Head: Normocephalic.     Right Ear: Tympanic membrane normal.     Left Ear: Tympanic membrane normal.     Nose: Mucosal edema and congestion present.     Right Sinus: Maxillary sinus tenderness and frontal sinus tenderness present.     Left Sinus: Maxillary sinus tenderness and frontal sinus tenderness present.     Mouth/Throat:     Mouth: Mucous membranes are dry.     Pharynx: No oropharyngeal exudate or posterior oropharyngeal erythema.  Eyes:     Extraocular Movements: Extraocular movements intact.     Pupils: Pupils are equal, round, and reactive to light.  Cardiovascular:     Rate and Rhythm: Normal rate and regular rhythm.     Pulses: Normal  pulses.     Heart sounds: Normal heart sounds.  Pulmonary:     Effort: Pulmonary effort is normal.     Breath sounds: Normal breath sounds.  Musculoskeletal:     Cervical back: Normal range of motion.  Neurological:     General: No focal deficit present.     Mental Status: She is alert and oriented to person, place, and time. Mental status is at baseline.  Psychiatric:        Mood and Affect: Mood normal.        Behavior: Behavior normal.        Thought Content: Thought content normal.        Judgment: Judgment normal.     Assessment & Plan:  Acute non-recurrent pansinusitis Assessment & Plan: Prescription given for augmentin 875/125 mg po bid for ten days. Pt to continue tylenol/ibuprofen prn sinus pain. Continue with humidifier prn and steam showers recommended as well. instructed If no symptom improvement in 48 hours please f/u  Pt states tolerates augmentin well with her colitis  Start rx for prednisone as well   Orders: -     Amoxicillin-Pot Clavulanate; Take 1 tablet by mouth 2 (two) times daily.  Dispense: 20 tablet; Refill: 0 -     predniSONE; Take as directed  Dispense: 1 each; Refill: 0     Follow up plan: Return if symptoms worsen or fail to improve.  Mort Sawyers, FNP

## 2023-02-07 NOTE — Assessment & Plan Note (Signed)
Prescription given for augmentin 875/125 mg po bid for ten days. Pt to continue tylenol/ibuprofen prn sinus pain. Continue with humidifier prn and steam showers recommended as well. instructed If no symptom improvement in 48 hours please f/u  Pt states tolerates augmentin well with her colitis  Start rx for prednisone as well

## 2023-02-19 ENCOUNTER — Encounter: Payer: Self-pay | Admitting: Family

## 2023-02-19 DIAGNOSIS — J3489 Other specified disorders of nose and nasal sinuses: Secondary | ICD-10-CM

## 2023-02-22 MED ORDER — PREDNISONE 20 MG PO TABS: ORAL_TABLET | ORAL | 0 refills | Status: DC

## 2023-02-22 NOTE — Addendum Note (Signed)
Addended by: Mort Sawyers on: 02/22/2023 11:57 AM   Modules accepted: Orders

## 2023-02-28 NOTE — Progress Notes (Signed)
[facility] Return Visit   Referring Provider Mort Sawyers, FNP [address] [place],  [place] [address]  Primary Care Provider Mort Sawyers, FNP  Patient Profile: Ebony [name] is a 53 y.o. female who returns to the [facility] for follow-up of the problem(s) noted below.  Problem List: Ulcerative proctosigmoiditis diagnosed 1995 Possible history of colon polyps  History of Present Illness   Ebony [name] was last seen in the GI office 10/29/2021 by Dr. Orvan Falconer   Current GI Meds  Mesalamine 4.8 g daily Flaxseed oil  Interval History  Ebony [name] returns to the office today reporting that she has overall been stable from an inflammatory bowel disease perspective since her last visit Notes mild flare symptoms last summer that have resolved Last fecal calprotectin 12/30/2022 normal at 21  On average she is having up to 2 formed bowel movements a day without any blood or mucus Denies fecal urgency, tenesmus or nocturnal bowel movements No abdominal pain or cramping Endorses intermittent symptoms of bloating and fullness No upper GI tract symptoms of GERD, nausea, vomiting, dysphagia or odynophagia  Records indicate that in the past Ebony [name] has experienced joint pain possibly related to her IBD No current fevers, chills, joint pain, skin rashes or oral ulcers  She identifies gluten and sugar as triggers for GI symptoms and tends to avoid these Follows a vegetarian diet  Notes that she has had some changes in her bowels after starting a new thyroid medication  No family history of colorectal cancer, polyps  Last colonoscopy: 07/2018 - anal fissure and continuous, ulcerated mucosa in the rectum and sigmoid colon  Last endoscopy: 07/2018 -multiple erosions in the gastric body and duodenal bulb  Last Abd CT/CTE/MRE: None  GI Review of Symptoms Significant for None. Otherwise negative.  General Review of Systems  Review of systems is  significant for the pertinent positives and negatives as listed per the HPI.  Full ROS is otherwise negative.  Inflammatory Bowel Disease History  Diagnosed with ulcerative proctosigmoiditis 1995 after presenting with diarrhea, rectal bleeding requiring hospitalization and blood transfusions -managed with steroids and mesalamine Lost insurance and was not seen for several years until 2016 Colonoscopy 07/2018 -anal fissure and continuous, ulcerated mucosa in the rectum and sigmoid colon --> tx'd w/ Rowasa, budesonide and Lialda Flex sig 12/2019 - Mayo 0 (only patchy erythema in rectum)  IBD Medication History Steroids Oral and rectal mesalamine Never on biologic therapy  Past Medical History   Past Medical History:  Diagnosis Date   Anemia    Arthritis    Blood transfusion without reported diagnosis unknown   for tx uc   Colon polyps 1997 or 1998?   GI bleeding    Hypothyroidism    Ulcerative colitis [facility])      Past Surgical History   Past Surgical History:  Procedure Laterality Date   BASAL CELL CARCINOMA EXCISION  2020   left breast   COLONOSCOPY  1997 or 1998?   HEMORRHOIDECTOMY WITH HEMORRHOID BANDING       Allergies and Medications  No Known Allergies   Current Meds  Medication Sig   augmented betamethasone dipropionate (DIPROLENE-AF) 0.05 % cream APPLY ON RASH TWICE DAILY AS NEEDED FOR FLARES   BIOTIN PO Take 1 tablet by mouth daily.   budesonide (ENTOCORT EC) 3 MG 24 hr capsule Take 3 capsules (9 mg total) by mouth daily.   cholecalciferol (VITAMIN D3) 25 MCG (1000 UNIT) tablet Take 1,000 Units by mouth  daily.   Flaxseed, Linseed, (FLAXSEED OIL PO) Take 1 tablet by mouth as needed.   levothyroxine (SYNTHROID) 50 MCG tablet Take 1 tablet (50 mcg total) by mouth daily.   mesalamine (LIALDA) 1.2 g EC tablet TAKE 1 TABLET BY MOUTH 4 TIMES DAILY (NEEDS  APPOINTMENT  FOR  FUTURE  REFILLS) (CALL [phone] TO SCHEDULE AN APPOINTMENT)   traMADol (ULTRAM) 50 MG tablet  Take 1 tablet (50 mg total) by mouth every 6 (six) hours as needed. (Patient taking differently: Take 50 mg by mouth as needed.)   traZODone (DESYREL) 50 MG tablet Take 0.5-1 tablets (25-50 mg total) by mouth at bedtime as needed for sleep. (Patient taking differently: Take 25-50 mg by mouth as needed for sleep.)     Family History   Family History  Problem Relation Age of Onset   Diabetes Mother    Hypertension Mother    Dementia Mother        senile   Heart attack Mother 75   Diabetes Father    Heart disease Father    Hypertension Father    Hyperlipidemia Father    Heart attack Father 52   Other Father        heart bypass and heart transplant   Diabetes Brother    Heart disease Brother    Heart failure Brother    Heart attack Brother 69   Kidney failure Brother 42       dialysis   Heart disease Maternal Grandmother    Heart attack Maternal Grandmother    Other Paternal Grandmother        carotid blockage   Stroke Paternal Grandmother    Colon cancer Neg Hx    Colon polyps Neg Hx    Esophageal cancer Neg Hx    Gallbladder disease Neg Hx    Breast cancer Neg Hx     Social History   Social History   Tobacco Use   Smoking status: Never   Smokeless tobacco: Never  Vaping Use   Vaping status: Never Used  Substance Use Topics   Alcohol use: Yes    Alcohol/week: 0.0 standard drinks of alcohol    Comment: Rarely-about 2 times a year if that   Drug use: No   Ebony [name] reports that she has never smoked. She has never used smokeless tobacco. She reports current alcohol use. She reports that she does not use drugs.  Vital Signs and Physical Examination   Vitals:   03/01/23 1548  BP: 112/80  Pulse: 84  SpO2: 98%   Body mass index is 26.95 kg/m. Weight: 138 lb (62.6 kg)  General: Well developed, well nourished, no acute distress Head: Normocephalic and atraumatic Eyes: Sclerae anicteric, EOMI Lungs: Clear throughout to auscultation Heart: Regular rate and rhythm;  No murmurs, rubs or bruits Abdomen: Soft, non tender and non distended. No masses, hepatosplenomegaly or hernias noted. Normal Bowel sounds Rectal: Deferred Musculoskeletal: Symmetrical with no gross deformities  Extremities: No edema or deformities noted  Review of Data  The following data was reviewed at the time of this encounter:  Laboratory Studies      Latest Ref Rng & Units 12/08/2022    9:03 AM 11/24/2021    9:24 AM 10/29/2021   10:16 AM  CBC  WBC 4.0 - 10.5 K/uL 4.3  4.5  4.9   Hemoglobin 12.0 - 15.0 g/dL 13.0  86.5  78.4   Hematocrit 36.0 - 46.0 % 42.0  40.6  40.1   Platelets 150.0 -  400.0 K/uL 281.0  339.0  313.0     No results found for: "LIPASE"    Latest Ref Rng & Units 06/22/2022   11:42 AM 10/29/2021   10:16 AM 11/20/2020   11:04 AM  CMP  Glucose 70 - 99 mg/dL 86  88  80   BUN 6 - 23 mg/dL 8  7  7    Creatinine 0.40 - 1.20 mg/dL 9.60  4.54  0.98   Sodium 135 - 145 mEq/L 138  136  139   Potassium 3.5 - 5.1 mEq/L 4.0  3.7  4.3   Chloride 96 - 112 mEq/L 102  101  103   CO2 19 - 32 mEq/L 30  26  29    Calcium 8.4 - 10.5 mg/dL 9.7  9.2  9.3   Total Protein 6.0 - 8.3 g/dL  7.3  6.7   Total Bilirubin 0.2 - 1.2 mg/dL  0.4  0.4   Alkaline Phos 39 - 117 U/L  52  55   AST 0 - 37 U/L  16  26   ALT 0 - 35 U/L  13  23     IBD Labs  Prebiologic Labs   Therapeutic Drug Monitoring Thiopurine metabolite levels:  Date:                6-TGN       6-MMP  Biologic level and antibodies:  Fecal Calprotectin 12/2022     21  Imaging Studies  AUS 01/05/2019 The patient's palpable area of concern corresponds to a 3.6 cm mass. This is most likely to represent a benign lipoma. If there is interval growth, follow-up is recommended.  GI Procedures and Studies  Flex Sig 12/2019 Normal except mild patchy inflammation in rectum  EGD/Colonoscopy 07/2018 EGD  Erosive gastropathy and duodenopathy Anal fissure, moderate inflammation to 30 cm c/w ulcerative  proctosigmoiditis  Clinical Impression  It is my clinical impression that Ebony [name] is a 53 y.o. female with;  Ulcerative proctosigmoiditis diagnosed 1995 Possible history of colon polyps  Ebony [name] was diagnosed with inflammatory bowel disease phenotyped as ulcerative colitis in 1995 after presenting with symptoms of diarrhea and rectal bleeding.  Her records suggest that she had a severe phenotype at diagnosis requiring hospitalization and blood transfusions.  She was initially managed with steroids as well as mesalamine.  She lost insurance and was not seen in the office again until 2016 at which time she resumed mesalamine therapy.  When she was seen in the office in 2020 she had incurred flare symptoms.  Restaging colonoscopy disclosed evidence of ulcerative proctosigmoiditis.  She was managed with Rowasa, budesonide and Lialda which resulted in remission of symptoms.  Subsequent flexible sigmoidoscopy 12/2019 confirmed to [facility] 0 disease in the rectosigmoid and descending colon.  Ebony [name] has remained on oral mesalamine 4.8 g daily and states that this is generally controlled her ulcerative colitis.  Notes that she had mild flare symptoms over the summertime which have subsequently resolved.  Fecal calprotectin 12/2022 was normal at 21.  At today's visit we discussed continuing her current management strategy with dose optimized oral mesalamine.  She mentioned that mesalamine is fairly costly with her current insurance plan.  She is aware of options of [facility].  I also suggested [place] cost plus pharmacy.  Reviewed that she is due for a restaging and surveillance colonoscopy which Ebony [name] will schedule within the next year.   Plan  Continue mesalamine 4.8 g daily -review pharmacy options [facility] [place]  cost plus pharmacy Mesalamine monitoring labs up-to-date -normal CBC 11/2022 and metabolic panel 06/2022 Schedule restaging and surveillance colonoscopy 2025 Follow periodic fecal calprotectin  assessments-12/2022 normal Continue flaxseed oil Monitor weight and anthropometrics   IBD Health Maintenance  Vaccinations Influenza: PCV13: PPSV23: COVID19: HAV/HBV: Shingles: HPV:  DEXA PRN  Pap Smear   Eye Exam PRN  Skin Exam N/A  Surveillance Colonoscopy Due 2025  Tobacco Use None  Depression Screen      Planned Follow Up  1 year  The patient or caregiver verbalized understanding of the material covered, with no barriers to understanding. All questions were answered. Patient or caregiver is agreeable with the plan outlined above.    It was a pleasure to see Ebony [name].  If you have any questions or concerns regarding this evaluation, do not hesitate to contact me.  Ebony Beach, MD [facility]  I spent total of 30 minutes in both face-to-face and non-face-to-face activities, excluding procedures performed, for the visit on the date of this encounter.

## 2023-03-01 ENCOUNTER — Encounter: Payer: Self-pay | Admitting: Pediatrics

## 2023-03-01 ENCOUNTER — Ambulatory Visit: Payer: BC Managed Care – PPO | Admitting: Pediatrics

## 2023-03-01 VITALS — BP 112/80 | HR 84 | Ht 60.0 in | Wt 138.0 lb

## 2023-03-01 DIAGNOSIS — K5130 Ulcerative (chronic) rectosigmoiditis without complications: Secondary | ICD-10-CM

## 2023-03-01 NOTE — Patient Instructions (Addendum)
You will be due for labs in 6 months.  We will contact you when it is time to go  have them done.  The CPT code for a colonoscopy is [phone]. You can call your insurance company and provide them with this information and they should be able to give you an estimate of what the cost would be. If you want to schedule please call the office [phone].   Please follow up in 1 year.   Thank you for entrusting me with your care and for choosing [facility], Dr. Maren Beach   If your blood pressure at your visit was 140/90 or greater, please contact your primary care physician to follow up on this. ______________________________________________________  If you are age 51 or older, your body mass index should be between 23-30. Your Body mass index is 26.95 kg/m. If this is out of the aforementioned range listed, please consider follow up with your Primary Care Provider.  If you are age 51 or younger, your body mass index should be between 19-25. Your Body mass index is 26.95 kg/m. If this is out of the aformentioned range listed, please consider follow up with your Primary Care Provider.  ________________________________________________________  The [facility] providers would like to encourage you to use [facility] to communicate with providers for non-urgent requests or questions.  Due to long hold times on the telephone, sending your provider a message by [facility] may be a faster and more efficient way to get a response.  Please allow 48 business hours for a response.  Please remember that this is for non-urgent requests.  _______________________________________________________  Due to recent changes in healthcare laws, you may see the results of your imaging and laboratory studies on MyChart before your provider has had a chance to review them.  We understand that in some cases there may be results that are confusing or concerning to you. Not all laboratory results come back in the same time  frame and the provider may be waiting for multiple results in order to interpret others.  Please give [place] 48 hours in order for your provider to thoroughly review all the results before contacting the office for clarification of your results.

## 2023-03-08 ENCOUNTER — Encounter: Payer: Self-pay | Admitting: Pediatrics

## 2023-03-31 ENCOUNTER — Other Ambulatory Visit: Payer: Self-pay | Admitting: Family

## 2023-03-31 DIAGNOSIS — G479 Sleep disorder, unspecified: Secondary | ICD-10-CM

## 2023-04-13 ENCOUNTER — Encounter: Payer: Self-pay | Admitting: Family

## 2023-04-13 DIAGNOSIS — E039 Hypothyroidism, unspecified: Secondary | ICD-10-CM

## 2023-04-18 ENCOUNTER — Encounter: Payer: Self-pay | Admitting: Family

## 2023-04-18 ENCOUNTER — Other Ambulatory Visit (INDEPENDENT_AMBULATORY_CARE_PROVIDER_SITE_OTHER)

## 2023-04-18 ENCOUNTER — Other Ambulatory Visit: Payer: Self-pay | Admitting: Family

## 2023-04-18 DIAGNOSIS — E039 Hypothyroidism, unspecified: Secondary | ICD-10-CM | POA: Diagnosis not present

## 2023-04-18 LAB — TSH: TSH: 0.21 u[IU]/mL — ABNORMAL LOW (ref 0.35–5.50)

## 2023-04-18 MED ORDER — LEVOTHYROXINE SODIUM 25 MCG PO TABS: 25.0000 ug | ORAL_TABLET | Freq: Every day | ORAL | 3 refills | Status: DC

## 2023-04-21 ENCOUNTER — Telehealth: Payer: Self-pay | Admitting: Pediatrics

## 2023-04-21 NOTE — Telephone Encounter (Signed)
 Inbound call from patient wanting to inform she has decided to schedule colonoscopy. Patient has been scheduled for 5/8 and PV 4/24.

## 2023-05-12 ENCOUNTER — Ambulatory Visit (AMBULATORY_SURGERY_CENTER)

## 2023-05-12 VITALS — Ht 60.0 in | Wt 135.0 lb

## 2023-05-12 DIAGNOSIS — K5130 Ulcerative (chronic) rectosigmoiditis without complications: Secondary | ICD-10-CM

## 2023-05-12 DIAGNOSIS — K5180 Other ulcerative colitis without complications: Secondary | ICD-10-CM

## 2023-05-12 MED ORDER — NA SULFATE-K SULFATE-MG SULF 17.5-3.13-1.6 GM/177ML PO SOLN: 1.0000 | Freq: Once | ORAL | 0 refills | Status: AC

## 2023-05-12 NOTE — Progress Notes (Signed)
 Pre visit completed via phone call; Patient verified name, DOB, and address; No egg or soy allergy known to patient;  No issues known to pt with past sedation with any surgeries or procedures; Patient denies ever being told they had issues or difficulty with intubation;  No FH of Malignant Hyperthermia; Pt is not on diet pills; Pt is not on home 02;  Pt is not on blood thinners;  Pt denies issues with constipation currently;  No A fib or A flutter Have any cardiac testing pending--NO Insurance verified during PV appt--- BCBS  PPO Pt can ambulate without assistance;  Pt denies use of chewing tobacco; Discussed diabetic/weight loss medication holds; Discussed NSAID holds; Checked BMI to be less than 50; Pt instructed to use [employer] or GoodRx for a price reduction on prep;  Patient's chart reviewed by Rogena Class CNRA prior to previsit and patient appropriate for the [facility];  Pre visit completed and red dot placed by patient's name on their procedure day (on provider's schedule);  Instructions sent to MyChart per patient request;

## 2023-05-16 ENCOUNTER — Encounter: Payer: Self-pay | Admitting: Pediatrics

## 2023-05-17 NOTE — Telephone Encounter (Signed)
 Information sent to MyChart again

## 2023-05-25 NOTE — Progress Notes (Unsigned)
 [facility] History and Physical   Primary Care Physician:  Felicita Horns, FNP   Reason for Procedure:  Surveillance colonoscopy-history of ulcerative proctosigmoiditis diagnosed in 1995  Plan:    Colonoscopy     HPI: Ebony [name] is a 54 y.o. female undergoing surveillance colonoscopy for history of ulcerative proctosigmoiditis diagnosed in 1995.  Patient is currently managed with mesalamine  4.8 g daily.  Noted to have active inflammation on colonoscopy in 2020.  Subsequently treated with Rowasa , budesonide  and Lialda .  Follow-up flexible sigmoidoscopy 12/2019 showed Mayo 0 disease with only patchy erythema in the rectum.  Patient reports being in clinical remission.  No family history of colorectal cancer or polyps.   Past Medical History:  Diagnosis Date   Anemia    Arthritis    Blood transfusion without reported diagnosis unknown   for tx uc   Colon polyps 1997 or 1998?   GI bleeding    Hypothyroidism    Ulcerative colitis (HCC)     Past Surgical History:  Procedure Laterality Date   BASAL CELL CARCINOMA EXCISION  2020   left breast/back/bilateral leg   COLONOSCOPY  2020   KB-MAC-prep good-anal fissure/moderate inflammation to 30 cm   HEMORRHOIDECTOMY WITH HEMORRHOID BANDING      Prior to Admission medications   Medication Sig Start Date End Date Taking? Authorizing Provider  augmented betamethasone dipropionate (DIPROLENE-AF) 0.05 % cream APPLY ON RASH TWICE DAILY AS NEEDED FOR FLARES 11/01/18   [provider]  BIOTIN PO Take 1 tablet by mouth daily.    [provider]  budesonide  (ENTOCORT EC ) 3 MG 24 hr capsule Take 3 capsules (9 mg total) by mouth daily. Patient not taking: Reported on 05/12/2023 10/29/21   Esterwood, Amy S, PA-C  cholecalciferol (VITAMIN D3) 25 MCG (1000 UNIT) tablet Take 1,000 Units by mouth daily.    [provider]  Flaxseed, Linseed, (FLAXSEED OIL PO) Take 1 tablet by mouth as needed.    [provider]  levothyroxine  (SYNTHROID ) 25 MCG tablet Take 1 tablet (25 mcg total) by mouth daily. 04/18/23   Dugal, Tabitha, FNP  mesalamine  (LIALDA ) 1.2 g EC tablet TAKE 1 TABLET BY MOUTH 4 TIMES DAILY (NEEDS  APPOINTMENT  FOR  FUTURE  REFILLS) (CALL [phone] TO SCHEDULE AN APPOINTMENT) 02/03/23   Esterwood, Amy S, PA-C  traMADol  (ULTRAM ) 50 MG tablet Take 1 tablet (50 mg total) by mouth every 6 (six) hours as needed. Patient taking differently: Take 50 mg by mouth as needed. 01/14/22   Copland, Alicia B, PA-C  traZODone  (DESYREL ) 50 MG tablet TAKE 1/2 TO 1 (ONE-HALF TO ONE) TABLET BY MOUTH AT BEDTIME AS NEEDED FOR SLEEP 03/31/23   Felicita Horns, FNP    Current Outpatient Medications  Medication Sig Dispense Refill   augmented betamethasone dipropionate (DIPROLENE-AF) 0.05 % cream APPLY ON RASH TWICE DAILY AS NEEDED FOR FLARES     BIOTIN PO Take 1 tablet by mouth daily.     budesonide  (ENTOCORT EC ) 3 MG 24 hr capsule Take 3 capsules (9 mg total) by mouth daily. (Patient not taking: Reported on 05/12/2023) 90 capsule 4   cholecalciferol (VITAMIN D3) 25 MCG (1000 UNIT) tablet Take 1,000 Units by mouth daily.     Flaxseed, Linseed, (FLAXSEED OIL PO) Take 1 tablet by mouth as needed.     levothyroxine  (SYNTHROID ) 25 MCG tablet Take 1 tablet (25 mcg total) by mouth daily. 90 tablet 3   mesalamine  (LIALDA ) 1.2 g EC tablet TAKE 1 TABLET  BY MOUTH 4 TIMES DAILY (NEEDS  APPOINTMENT  FOR  FUTURE  REFILLS) (CALL [phone] TO SCHEDULE AN APPOINTMENT) 120 tablet 0   traMADol  (ULTRAM ) 50 MG tablet Take 1 tablet (50 mg total) by mouth every 6 (six) hours as needed. (Patient taking differently: Take 50 mg by mouth as needed.) 20 tablet 0   traZODone  (DESYREL ) 50 MG tablet TAKE 1/2 TO 1 (ONE-HALF TO ONE) TABLET BY MOUTH AT BEDTIME AS NEEDED FOR SLEEP 30 tablet 0   No current facility-administered medications for this visit.    Allergies as of 05/26/2023   (No Known Allergies)    Family History   Problem Relation Age of Onset   Diabetes Mother    Hypertension Mother    Dementia Mother        senile   Heart attack Mother 77   Diabetes Father    Heart disease Father    Hypertension Father    Hyperlipidemia Father    Heart attack Father 68   Other Father        heart bypass and heart transplant   Diabetes Brother    Heart disease Brother    Heart failure Brother    Heart attack Brother 67   Kidney failure Brother 31       dialysis   Heart disease Maternal Grandmother    Heart attack Maternal Grandmother    Other Paternal Grandmother        carotid blockage   Stroke Paternal Grandmother    Colon cancer Neg Hx    Colon polyps Neg Hx    Esophageal cancer Neg Hx    Gallbladder disease Neg Hx    Breast cancer Neg Hx    Rectal cancer Neg Hx    Stomach cancer Neg Hx     Social History   Socioeconomic History   Marital status: Significant Other    Spouse name: Not on file   Number of children: 1   Years of education: High school   Highest education level: Not on file  Occupational History   Occupation: Investment banker, corporate: Self Employed  Tobacco Use   Smoking status: Never   Smokeless tobacco: Never  Vaping Use   Vaping status: Never Used  Substance and Sexual Activity   Alcohol use: Yes    Alcohol/week: 0.0 standard drinks of alcohol    Comment: Rarely-about 2 times a year if that   Drug use: No   Sexual activity: Not Currently    Partners: Male    Birth control/protection: None  Other Topics Concern   Not on file  Social History Narrative   11/16/18   From: the area   Living: son tyler and boyfriend Ambrosio Junker (15 yos)   Work: in [employer]      Family: Has son Herminia Lope (67 yo), boyfriend      Enjoys: cooking for other people, walking/hiking      Exercise: walking and weight training   Diet: small meals, only once per day      Safety   Seat belts: Yes    Guns: Yes  and secure   Safe in relationships: Yes    Social Drivers of Health   Financial Resource  Strain: Low Risk  (11/16/2018)   Overall Financial Resource Strain (CARDIA)    Difficulty of Paying Living Expenses: Not hard at all  Food Insecurity: Not on file  Transportation Needs: Not on file  Physical Activity: Not on file  Stress: Not on file  Social Connections: Not on file  Intimate Partner Violence: Not on file    Review of Systems:  All other review of systems negative except as mentioned in the HPI.  Physical Exam: Vital signs There were no vitals taken for this visit.  General:   Alert,  Well-developed, well-nourished, pleasant and cooperative in NAD Airway:  Mallampati 2 Lungs:  Clear throughout to auscultation.   Heart:  Regular rate and rhythm; no murmurs, clicks, rubs,  or gallops. Abdomen:  Soft, nontender and nondistended. Normal bowel sounds.   Neuro/Psych:  Normal mood and affect. A and O x 3  Eugenia Hess, MD [facility]

## 2023-05-26 ENCOUNTER — Ambulatory Visit: Admitting: Pediatrics

## 2023-05-26 ENCOUNTER — Encounter: Payer: Self-pay | Admitting: Pediatrics

## 2023-05-26 VITALS — BP 100/59 | HR 87 | Temp 97.4°F | Resp 15 | Ht 60.0 in | Wt 135.0 lb

## 2023-05-26 DIAGNOSIS — K5130 Ulcerative (chronic) rectosigmoiditis without complications: Secondary | ICD-10-CM

## 2023-05-26 DIAGNOSIS — K6389 Other specified diseases of intestine: Secondary | ICD-10-CM | POA: Diagnosis not present

## 2023-05-26 DIAGNOSIS — K5180 Other ulcerative colitis without complications: Secondary | ICD-10-CM

## 2023-05-26 DIAGNOSIS — Z1211 Encounter for screening for malignant neoplasm of colon: Secondary | ICD-10-CM | POA: Diagnosis not present

## 2023-05-26 MED ORDER — SODIUM CHLORIDE 0.9 % IV SOLN: 500.0000 mL | Freq: Once | INTRAVENOUS | Status: DC

## 2023-05-26 NOTE — Patient Instructions (Signed)
Resume previous diet and medications. Awaiting pathology results. Repeat Colonoscopy date to be determined based on pathology results.  YOU HAD AN ENDOSCOPIC PROCEDURE TODAY AT THE [facility]:   Refer to the procedure report that was given to you for any specific questions about what was found during the examination.  If the procedure report does not answer your questions, please call your gastroenterologist to clarify.  If you requested that your care partner not be given the details of your procedure findings, then the procedure report has been included in a sealed envelope for you to review at your convenience later.  YOU SHOULD EXPECT: Some feelings of bloating in the abdomen. Passage of more gas than usual.  Walking can help get rid of the air that was put into your GI tract during the procedure and reduce the bloating. If you had a lower endoscopy (such as a colonoscopy or flexible sigmoidoscopy) you may notice spotting of blood in your stool or on the toilet paper. If you underwent a bowel prep for your procedure, you may not have a normal bowel movement for a few days.  Please Note:  You might notice some irritation and congestion in your nose or some drainage.  This is from the oxygen used during your procedure.  There is no need for concern and it should clear up in a day or so.  SYMPTOMS TO REPORT IMMEDIATELY:  Following lower endoscopy (colonoscopy or flexible sigmoidoscopy):  Excessive amounts of blood in the stool  Significant tenderness or worsening of abdominal pains  Swelling of the abdomen that is new, acute  Fever of 100F or higher   For urgent or emergent issues, a gastroenterologist can be reached at any hour by calling [phone]. Do not use MyChart messaging for urgent concerns.    DIET:  We do recommend a small meal at first, but then you may proceed to your regular diet.  Drink plenty of fluids but you should avoid alcoholic beverages for 24  hours.  ACTIVITY:  You should plan to take it easy for the rest of today and you should NOT DRIVE or use heavy machinery until tomorrow (because of the sedation medicines used during the test).    FOLLOW UP: Our staff will call the number listed on your records the next business day following your procedure.  We will call around 7:15- 8:00 am to check on you and address any questions or concerns that you may have regarding the information given to you following your procedure. If we do not reach you, we will leave a message.     If any biopsies were taken you will be contacted by phone or by letter within the next 1-3 weeks.  Please call us at [phone] if you have not heard about the biopsies in 3 weeks.    SIGNATURES/CONFIDENTIALITY: You and/or your care partner have signed paperwork which will be entered into your electronic medical record.  These signatures attest to the fact that that the information above on your After Visit Summary has been reviewed and is understood.  Full responsibility of the confidentiality of this discharge information lies with you and/or your care-partner. 

## 2023-05-26 NOTE — Progress Notes (Signed)
 Pt's states no medical or surgical changes since previsit or office visit.

## 2023-05-26 NOTE — Op Note (Signed)
  [facility] Patient Name: Ebony [name] Procedure Date: 05/26/2023 2:04 PM MRN: [id number] Endoscopist: Eugenia Hess , MD, [id number] Age: 53 Referring MD:  Date of Birth: 09-Nov-1970 Gender: Female Account #: [id number] Procedure:                Colonoscopy Indications:              High risk colon cancer surveillance: Ulcerative                            proctosigmoiditis, Last colonoscopy: July 2020;.                            Ulcerative proctosigmoiditis diagnosed 1995.                            Patient is currently on maintenance mesalamine  4.8                            g daily and reports clinical remission of symptoms                            at the time of this exam. Medicines:                Monitored Anesthesia Care Procedure:                Pre-Anesthesia Assessment:                           - Prior to the procedure, a History and Physical                            was performed, and patient medications and                            allergies were reviewed. The patient's tolerance of                            previous anesthesia was also reviewed. The risks                            and benefits of the procedure and the sedation                            options and risks were discussed with the patient.                            All questions were answered, and informed consent                            was obtained. Prior Anticoagulants: The patient has                            taken no anticoagulant or antiplatelet agents. ASA  Grade Assessment: II - A patient with mild systemic                            disease. After reviewing the risks and benefits,                            the patient was deemed in satisfactory condition to                            undergo the procedure.                           After obtaining informed consent, the colonoscope                            was passed under direct vision.  Throughout the                            procedure, the patient's blood pressure, pulse, and                            oxygen saturations were monitored continuously. The                            CF [id number] #[id number] was introduced through the anus                            and advanced to the cecum, identified by                            appendiceal orifice and ileocecal valve. The                            colonoscopy was performed without difficulty. The                            patient tolerated the procedure well. The quality                            of the bowel preparation was good. The ileocecal                            valve, appendiceal orifice, and rectum were                            photographed. Scope In: 2:15:51 PM Scope Out: 2:33:50 PM Scope Withdrawal Time: 0 hours 13 minutes 33 seconds  Total Procedure Duration: 0 hours 17 minutes 59 seconds  Findings:                 The perianal and digital rectal examinations were                            normal. Pertinent negatives include normal  sphincter tone and no palpable rectal lesions.                           Inflammation was not found based on the endoscopic                            appearance of the mucosa in the colon. This was                            graded as Mayo Score 0 (normal or inactive                            disease). Biopsies were taken with a cold forceps                            for histology from the right and left colon.                           Four biopsies were taken every 10 cm with a cold                            forceps from the rectosigmoid colon for ulcerative                            colitis surveillance. These biopsy specimens were                            sent to Pathology.                           The retroflexed view of the distal rectum and anal                            verge was normal and showed no anal or rectal                             abnormalities. Complications:            No immediate complications. Estimated blood loss:                            Minimal. Estimated Blood Loss:     Estimated blood loss was minimal. Impression:               - Inactive (Mayo Score 0) quiescent ulcerative                            colitis. Biopsied.                           - The distal rectum and anal verge are normal on                            retroflexion view.                           -  Biopsies for surveillance were taken from the                            rectosigmoid colon. Right and left colon were                            biopsied to exclude histologic evidence of                            underlying IBD in these regions. Recommendation:           - Discharge patient to home (ambulatory).                           - Await pathology results.                           - Repeat colonoscopy for surveillance based on                            pathology results.                           - Continue present medications.                           - The findings and recommendations were discussed                            with the patient's family.                           - Return to GI clinic as previously scheduled.                           - Patient has a contact number available for                            emergencies. The signs and symptoms of potential                            delayed complications were discussed with the                            patient. Return to normal activities tomorrow.                            Written discharge instructions were provided to the                            patient. Eugenia Hess, MD 05/26/2023 2:39:53 PM This report has been signed electronically.

## 2023-05-26 NOTE — Progress Notes (Signed)
 Report to PACU, RN, vss, BBS= Clear.

## 2023-05-26 NOTE — Progress Notes (Signed)
 Called to room to assist during endoscopic procedure.  Patient ID and intended procedure confirmed with present staff. Received instructions for my participation in the procedure from the performing physician.

## 2023-05-27 ENCOUNTER — Telehealth: Payer: Self-pay

## 2023-05-27 NOTE — Telephone Encounter (Signed)
  Follow up Call-     05/26/2023    1:14 PM  Call back number  Post procedure Call Back phone  # [phone]  Permission to leave phone message Yes     Patient questions:  Do you have a fever, pain , or abdominal swelling? No. Pain Score  0 *  Have you tolerated food without any problems? Yes.    Have you been able to return to your normal activities? Yes.    Do you have any questions about your discharge instructions: Diet   No. Medications  No. Follow up visit  No.  Do you have questions or concerns about your Care? No.  Actions: * If pain score is 4 or above: No action needed, pain <4.

## 2023-05-31 ENCOUNTER — Ambulatory Visit: Payer: Self-pay | Admitting: Pediatrics

## 2023-05-31 LAB — SURGICAL PATHOLOGY

## 2023-06-07 ENCOUNTER — Encounter (INDEPENDENT_AMBULATORY_CARE_PROVIDER_SITE_OTHER): Payer: Self-pay

## 2023-07-05 ENCOUNTER — Other Ambulatory Visit (INDEPENDENT_AMBULATORY_CARE_PROVIDER_SITE_OTHER)

## 2023-07-05 ENCOUNTER — Ambulatory Visit: Payer: Self-pay | Admitting: Family

## 2023-07-05 DIAGNOSIS — E039 Hypothyroidism, unspecified: Secondary | ICD-10-CM

## 2023-07-05 LAB — TSH: TSH: 4.53 u[IU]/mL (ref 0.35–5.50)

## 2023-08-01 DIAGNOSIS — Z85828 Personal history of other malignant neoplasm of skin: Secondary | ICD-10-CM | POA: Diagnosis not present

## 2023-08-01 DIAGNOSIS — L603 Nail dystrophy: Secondary | ICD-10-CM | POA: Diagnosis not present

## 2023-08-01 DIAGNOSIS — R21 Rash and other nonspecific skin eruption: Secondary | ICD-10-CM | POA: Diagnosis not present

## 2023-08-01 DIAGNOSIS — L920 Granuloma annulare: Secondary | ICD-10-CM | POA: Diagnosis not present

## 2023-08-01 DIAGNOSIS — L738 Other specified follicular disorders: Secondary | ICD-10-CM | POA: Diagnosis not present

## 2023-08-22 ENCOUNTER — Telehealth: Payer: Self-pay

## 2023-08-22 DIAGNOSIS — K5130 Ulcerative (chronic) rectosigmoiditis without complications: Secondary | ICD-10-CM

## 2023-08-22 NOTE — Telephone Encounter (Signed)
 Orders entered.  MyChart message to patient to go to the lab

## 2023-08-22 NOTE — Telephone Encounter (Signed)
-----   Message from [facility] Clarita H sent at 03/01/2023  4:51 PM EST ----- Regarding: labs due in August [place] patient due for  CBC, CMET, ESR and CRP

## 2023-10-03 ENCOUNTER — Other Ambulatory Visit: Payer: Self-pay | Admitting: Physician Assistant

## 2023-11-30 DIAGNOSIS — D225 Melanocytic nevi of trunk: Secondary | ICD-10-CM | POA: Diagnosis not present

## 2023-11-30 DIAGNOSIS — C4441 Basal cell carcinoma of skin of scalp and neck: Secondary | ICD-10-CM | POA: Diagnosis not present

## 2023-11-30 DIAGNOSIS — C44719 Basal cell carcinoma of skin of left lower limb, including hip: Secondary | ICD-10-CM | POA: Diagnosis not present

## 2023-11-30 DIAGNOSIS — D2262 Melanocytic nevi of left upper limb, including shoulder: Secondary | ICD-10-CM | POA: Diagnosis not present

## 2023-11-30 DIAGNOSIS — D2271 Melanocytic nevi of right lower limb, including hip: Secondary | ICD-10-CM | POA: Diagnosis not present

## 2023-11-30 DIAGNOSIS — D2272 Melanocytic nevi of left lower limb, including hip: Secondary | ICD-10-CM | POA: Diagnosis not present

## 2023-12-08 DIAGNOSIS — C44719 Basal cell carcinoma of skin of left lower limb, including hip: Secondary | ICD-10-CM | POA: Diagnosis not present

## 2023-12-08 DIAGNOSIS — C4441 Basal cell carcinoma of skin of scalp and neck: Secondary | ICD-10-CM | POA: Diagnosis not present

## 2023-12-13 ENCOUNTER — Encounter: Payer: Self-pay | Admitting: Family

## 2023-12-13 ENCOUNTER — Ambulatory Visit: Payer: BC Managed Care – PPO | Admitting: Family

## 2023-12-13 VITALS — BP 116/84 | HR 82 | Temp 98.2°F | Ht 60.0 in | Wt 138.4 lb

## 2023-12-13 DIAGNOSIS — R7689 Other specified abnormal immunological findings in serum: Secondary | ICD-10-CM | POA: Diagnosis not present

## 2023-12-13 DIAGNOSIS — Z23 Encounter for immunization: Secondary | ICD-10-CM

## 2023-12-13 DIAGNOSIS — D500 Iron deficiency anemia secondary to blood loss (chronic): Secondary | ICD-10-CM

## 2023-12-13 DIAGNOSIS — Z0000 Encounter for general adult medical examination without abnormal findings: Secondary | ICD-10-CM | POA: Diagnosis not present

## 2023-12-13 DIAGNOSIS — Z1231 Encounter for screening mammogram for malignant neoplasm of breast: Secondary | ICD-10-CM

## 2023-12-13 DIAGNOSIS — E039 Hypothyroidism, unspecified: Secondary | ICD-10-CM | POA: Diagnosis not present

## 2023-12-13 DIAGNOSIS — R5383 Other fatigue: Secondary | ICD-10-CM | POA: Diagnosis not present

## 2023-12-13 DIAGNOSIS — K5180 Other ulcerative colitis without complications: Secondary | ICD-10-CM | POA: Diagnosis not present

## 2023-12-13 DIAGNOSIS — E559 Vitamin D deficiency, unspecified: Secondary | ICD-10-CM | POA: Diagnosis not present

## 2023-12-13 DIAGNOSIS — J0110 Acute frontal sinusitis, unspecified: Secondary | ICD-10-CM

## 2023-12-13 DIAGNOSIS — E782 Mixed hyperlipidemia: Secondary | ICD-10-CM | POA: Diagnosis not present

## 2023-12-13 LAB — CBC WITH DIFFERENTIAL/PLATELET
Basophils Absolute: 0 K/uL (ref 0.0–0.1)
Basophils Relative: 0.3 % (ref 0.0–3.0)
Eosinophils Absolute: 0.1 K/uL (ref 0.0–0.7)
Eosinophils Relative: 1.5 % (ref 0.0–5.0)
HCT: 41.4 % (ref 36.0–46.0)
Hemoglobin: 13.9 g/dL (ref 12.0–15.0)
Lymphocytes Relative: 30.2 % (ref 12.0–46.0)
Lymphs Abs: 1.2 K/uL (ref 0.7–4.0)
MCHC: 33.6 g/dL (ref 30.0–36.0)
MCV: 89.6 fl (ref 78.0–100.0)
Monocytes Absolute: 0.3 K/uL (ref 0.1–1.0)
Monocytes Relative: 7.1 % (ref 3.0–12.0)
Neutro Abs: 2.5 K/uL (ref 1.4–7.7)
Neutrophils Relative %: 60.9 % (ref 43.0–77.0)
Platelets: 304 K/uL (ref 150.0–400.0)
RBC: 4.62 Mil/uL (ref 3.87–5.11)
RDW: 14 % (ref 11.5–15.5)
WBC: 4.1 K/uL (ref 4.0–10.5)

## 2023-12-13 LAB — B12 AND FOLATE PANEL
Folate: 8.7 ng/mL (ref >5.9)
Vitamin B-12: 257 pg/mL (ref 211–911)

## 2023-12-13 LAB — LIPID PANEL
Cholesterol: 246 mg/dL — ABNORMAL HIGH (ref 0–200)
HDL: 70.9 mg/dL (ref >39.00)
LDL Cholesterol: 151 mg/dL — ABNORMAL HIGH (ref 0–99)
NonHDL: 174.69
Total CHOL/HDL Ratio: 3
Triglycerides: 119 mg/dL (ref 0.0–149.0)
VLDL: 23.8 mg/dL (ref 0.0–40.0)

## 2023-12-13 LAB — VITAMIN D 25 HYDROXY (VIT D DEFICIENCY, FRACTURES): VITD: 31.66 ng/mL (ref 30.00–100.00)

## 2023-12-13 LAB — COMPREHENSIVE METABOLIC PANEL WITH GFR
ALT: 18 U/L (ref 0–35)
AST: 21 U/L (ref 0–37)
Albumin: 4.4 g/dL (ref 3.5–5.2)
Alkaline Phosphatase: 56 U/L (ref 39–117)
BUN: 8 mg/dL (ref 6–23)
CO2: 33 meq/L — ABNORMAL HIGH (ref 19–32)
Calcium: 9.8 mg/dL (ref 8.4–10.5)
Chloride: 102 meq/L (ref 96–112)
Creatinine, Ser: 0.6 mg/dL (ref 0.40–1.20)
GFR: 104.18 mL/min (ref >60.00)
Glucose, Bld: 80 mg/dL (ref 70–99)
Potassium: 4.4 meq/L (ref 3.5–5.1)
Sodium: 139 meq/L (ref 135–145)
Total Bilirubin: 0.5 mg/dL (ref 0.2–1.2)
Total Protein: 6.8 g/dL (ref 6.0–8.3)

## 2023-12-13 LAB — IBC + FERRITIN
Ferritin: 10.1 ng/mL (ref 10.0–291.0)
Iron: 101 ug/dL (ref 42–145)
Saturation Ratios: 36.3 % (ref 20.0–50.0)
TIBC: 278.6 ug/dL (ref 250.0–450.0)
Transferrin: 199 mg/dL — ABNORMAL LOW (ref 212.0–360.0)

## 2023-12-13 LAB — TSH: TSH: 3.6 u[IU]/mL (ref 0.35–5.50)

## 2023-12-13 LAB — SEDIMENTATION RATE: Sed Rate: 11 mm/h (ref 0–30)

## 2023-12-13 LAB — C-REACTIVE PROTEIN: CRP: <0.5 mg/dL (ref 0.5–20.0)

## 2023-12-13 MED ORDER — AMOXICILLIN-POT CLAVULANATE 875-125 MG PO TABS: 1.0000 | ORAL_TABLET | Freq: Two times a day (BID) | ORAL | 0 refills | Status: AC

## 2023-12-13 MED ORDER — LEVOTHYROXINE SODIUM 25 MCG PO TABS: ORAL_TABLET | ORAL | Status: DC

## 2023-12-13 NOTE — Progress Notes (Signed)
 Subjective:  Patient ID: Ebony [name], female    DOB: [birth year]  Age: 53 y.o. MRN: [id number]  Patient Care Team: Corwin Antu, FNP as PCP - General (Family Medicine) Eda Iha, MD (Inactive) as Consulting Physician (Gastroenterology)   CC:  Chief Complaint  Patient presents with   Annual Exam    HPI Ebony [name] is a 53 y.o. female who presents today for an annual physical exam. She reports consuming a general diet. Walks on the treadmill a few times a week and works on [employer] training She generally feels well. She reports sleeping fairly well. She does have additional problems to discuss today.   Vision:Not within last year Dental:Receives regular dental care  Mammogram: 01/17/23 Last pap: 01/14/22 negative, 01/07/21 ascus  Colonoscopy: May 26 2023, every three years   Pt is with acute concerns.   Discussed the use of AI scribe software for clinical note transcription with the patient, who gave verbal consent to proceed.  History of Present Illness CHARDE MACFARLANE is a 53 year old female with ulcerative colitis and thyroid  issues who presents for a follow-up regarding her symptoms and medication management.  She has been experiencing significant hair loss over the past year, which she finds very abnormal and concerning. The hair loss has become more noticeable and significant, although it may have started earlier. She is unsure if her medication, mesalamine , could be contributing to this issue.  She has a history of ulcerative colitis and is currently taking mesalamine . She has been experiencing changes in her bowel habits, noting a shift from frequent bowel movements to feeling more constipated.  She has a rash on her legs, diagnosed by dermatology as granuloma annulare. The rash has persisted for about two years and has spread to different areas of her legs. She has been using a cream as prescribed by dermatology.  She has ongoing thyroid  issues,  with her thyroid  levels fluctuating. She is on a regimen of levothyroxine , taking 25 mcg once daily Monday through Friday and two tablets on Saturday and Sunday. She is concerned about the impact of her thyroid  condition on her energy levels and hair loss.  She experiences fatigue and low energy levels, which she attributes to her thyroid  condition and other health issues. She exercises by walking on a treadmill and using small weights but finds her energy levels plateaued, especially after caring for her granddaughter.  She reports sleep disturbances, stating she falls asleep but wakes up frequently throughout the night. She has tried trazodone  for sleep, which helps her fall asleep but does not prevent her from waking up. She has also considered magnesium supplements to aid sleep.  She has been experiencing sinus symptoms for the past four days, including a scratchy throat. She is cautious about taking medications due to her ulcerative colitis and prefers to avoid antibiotics unless necessary.  She discusses her menopausal symptoms, noting that hot flashes have become more manageable. She is concerned about her energy levels and the potential need for hormone replacement therapy but prefers to avoid it unless necessary.   Advanced Directives Patient does not have advanced directives    DEPRESSION SCREENING    12/08/2022    8:33 AM 06/22/2022   10:48 AM 11/23/2021   11:40 AM 11/20/2020   11:25 AM 11/19/2019    9:25 AM 11/16/2018    9:51 AM  PHQ 2/9 Scores  PHQ - 2 Score 0 0 0 2 0 0  PHQ- 9 Score 2  6   3      Data saved with a previous flowsheet row definition     ROS: Negative unless specifically indicated above in HPI.    Current Outpatient Medications:    amoxicillin -clavulanate (AUGMENTIN ) 875-125 MG tablet, Take 1 tablet by mouth 2 (two) times daily., Disp: 20 tablet, Rfl: 0   augmented betamethasone dipropionate (DIPROLENE-AF) 0.05 % cream, APPLY ON RASH TWICE DAILY AS NEEDED  FOR FLARES, Disp: , Rfl:    BIOTIN PO, Take 1 tablet by mouth daily., Disp: , Rfl:    cholecalciferol (VITAMIN D3) 25 MCG (1000 UNIT) tablet, Take 1,000 Units by mouth daily., Disp: , Rfl:    Flaxseed, Linseed, (FLAXSEED OIL PO), Take 1 tablet by mouth as needed., Disp: , Rfl:    mesalamine  (LIALDA ) 1.2 g EC tablet, Take 4 tablets (4.8 g total) by mouth daily with breakfast., Disp: 120 tablet, Rfl: 3   traMADol  (ULTRAM ) 50 MG tablet, Take 1 tablet (50 mg total) by mouth every 6 (six) hours as needed. (Patient taking differently: Take 50 mg by mouth as needed.), Disp: 20 tablet, Rfl: 0   traZODone  (DESYREL ) 50 MG tablet, TAKE 1/2 TO 1 (ONE-HALF TO ONE) TABLET BY MOUTH AT BEDTIME AS NEEDED FOR SLEEP, Disp: 30 tablet, Rfl: 0   levothyroxine  (SYNTHROID ) 25 MCG tablet, Take one po every day Monday through Friday then two tablets once daily on Saturday and Sunday, Disp: , Rfl:     Objective:    BP 116/84 (BP Location: Left Arm, Patient Position: Sitting, Cuff Size: Normal)   Pulse 82   Temp 98.2 F (36.8 C) (Temporal)   Ht 5' (1.524 m)   Wt 138 lb 6.4 oz (62.8 kg)   SpO2 98%   BMI 27.03 kg/m   BP Readings from Last 3 Encounters:  12/13/23 116/84  05/26/23 (!) 100/59  03/01/23 112/80      Physical Exam Vitals reviewed.  Constitutional:      General: She is not in acute distress.    Appearance: Normal appearance. She is normal weight. She is not ill-appearing.  HENT:     Head: Normocephalic.     Right Ear: Tympanic membrane normal.     Left Ear: Tympanic membrane normal.     Nose: Nose normal.     Mouth/Throat:     Mouth: Mucous membranes are moist.  Eyes:     Extraocular Movements: Extraocular movements intact.     Pupils: Pupils are equal, round, and reactive to light.  Cardiovascular:     Rate and Rhythm: Normal rate and regular rhythm.  Pulmonary:     Effort: Pulmonary effort is normal.     Breath sounds: Normal breath sounds.  Abdominal:     General: Abdomen is flat.  Bowel sounds are normal.     Palpations: Abdomen is soft.     Tenderness: There is no guarding or rebound.  Musculoskeletal:        General: Normal range of motion.     Cervical back: Normal range of motion.  Skin:    General: Skin is warm.     Capillary Refill: Capillary refill takes less than 2 seconds.  Neurological:     General: No focal deficit present.     Mental Status: She is alert.  Psychiatric:        Mood and Affect: Mood normal.        Behavior: Behavior normal.        Thought Content: Thought content normal.  Judgment: Judgment normal.       Results PATHOLOGY Granuloma Annulare Biopsy: Granuloma annulare      Assessment & Plan:   Assessment and Plan Assessment & Plan Adult Wellness Visit Routine wellness visit with discussion on general health maintenance, including vaccinations and screenings. - Ordered mammogram at [facility] - Discussed shingles and pneumonia vaccines - Discussed Pap smear schedule and options for future screenings -Patient Counseling(The following topics were reviewed):  Preventative care handout given to pt  Health maintenance and immunizations reviewed. Please refer to Health maintenance section. Pt advised on safe sex, wearing seatbelts in car, and proper nutrition labwork ordered today for annual Dental health: Discussed importance of regular tooth brushing, flossing, and dental visits.  Hypothyroidism Hair loss possibly related to thyroid  dysfunction. Current levothyroxine  regimen is 25 mcg daily Monday through Friday and 50 mcg on Saturday and Sunday. - Ordered thyroid  function tests - Continue current levothyroxine  regimen  Ulcerative colitis Chronic condition managed with mesalamine . Potential link to granuloma annulare and autoimmune activity. - Ordered CRP and sed rate to assess inflammation - Continue mesalamine  as prescribed  Iron deficiency anemia Potential link to hair loss and fatigue. -  Ordered iron studies  Acute frontal sinusitis Symptoms began four days ago with scratchy throat and sinus congestion. Likely viral etiology, but bacterial infection considered if symptoms worsen. 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 Dermatology involved in management. - Continue to monitor rash and follow up with dermatology as needed  History of skin cancer Recent removal of skin cancer lesions. - Continue regular dermatology follow-ups  Menopausal symptoms Experiencing hot flashes and fatigue. Hormone replacement therapy discussed but not initiated due to manageable symptoms and potential risks. - Continue to monitor symptoms and consider hormone replacement therapy if symptoms worsen  Sleep disturbance Difficulty maintaining sleep, possibly related to anxiety and thyroid  issues. Trazodone  used occasionally. - Recommended magnesium glycinate 200-400 mg nightly - Continue trazodone  as needed for sleep  Fatigue Chronic fatigue possibly related to thyroid  dysfunction, ulcerative colitis, and menopausal symptoms. - Ordered thyroid  function tests - Ordered iron studies - Encouraged regular exercise and healthy diet          Follow-up: Return in about 1 year (around 12/12/2024) for f/u CPE.   Ginger Patrick, FNP

## 2023-12-13 NOTE — Patient Instructions (Signed)
  Try some magnesium glycinate 200-400 mg nightly

## 2023-12-14 LAB — ANA W/REFLEX: ANA Titer 1: NEGATIVE

## 2023-12-19 ENCOUNTER — Ambulatory Visit: Payer: Self-pay | Admitting: Family

## 2023-12-19 DIAGNOSIS — E782 Mixed hyperlipidemia: Secondary | ICD-10-CM

## 2023-12-19 DIAGNOSIS — D649 Anemia, unspecified: Secondary | ICD-10-CM

## 2023-12-21 NOTE — Addendum Note (Signed)
 Addended by: CORWIN ANTU on: 12/21/2023 10:52 AM   Modules accepted: Orders

## 2023-12-23 ENCOUNTER — Encounter: Payer: Self-pay | Admitting: Pediatrics

## 2023-12-23 ENCOUNTER — Encounter: Payer: Self-pay | Admitting: Family

## 2023-12-30 ENCOUNTER — Other Ambulatory Visit: Payer: Self-pay

## 2024-01-17 ENCOUNTER — Encounter: Payer: Self-pay | Admitting: Family

## 2024-01-17 ENCOUNTER — Other Ambulatory Visit: Payer: Self-pay | Admitting: Family

## 2024-01-17 DIAGNOSIS — E039 Hypothyroidism, unspecified: Secondary | ICD-10-CM

## 2024-01-17 MED ORDER — LEVOTHYROXINE SODIUM 25 MCG PO TABS: ORAL_TABLET | ORAL | 0 refills | Status: AC

## 2024-01-17 NOTE — Telephone Encounter (Signed)
 Spoke with pt and she would prefer to have a prescription sent in for 25mcg and 50mcg so that it's easier for her to keep up with. Rxs have been sent in. Nothing further was needed.

## 2024-01-18 ENCOUNTER — Ambulatory Visit (INDEPENDENT_AMBULATORY_CARE_PROVIDER_SITE_OTHER)

## 2024-01-18 DIAGNOSIS — Z23 Encounter for immunization: Secondary | ICD-10-CM | POA: Diagnosis not present

## 2024-01-18 NOTE — Progress Notes (Signed)
 Per orders of Ginger Patrick, NP, who is out of office and Dr Laine Balls who is in office injection of Prevnar 20 and flu vaccine given by Laray Arenas in bilaterally deltoid. Patient tolerated injection well. Pts first Prevnar 20 and pt waited without problem or concern.

## 2024-12-18 ENCOUNTER — Encounter: Admitting: Family
